# Patient Record
Sex: Female | Born: 1960
Health system: Southern US, Community
[De-identification: ages and names within clinical notes are randomized; demographics above are authoritative.]

## PROBLEM LIST (undated history)

## (undated) DIAGNOSIS — G43019 Migraine without aura, intractable, without status migrainosus: Secondary | ICD-10-CM

## (undated) DIAGNOSIS — R519 Headache, unspecified: Secondary | ICD-10-CM

## (undated) DIAGNOSIS — Z8619 Personal history of other infectious and parasitic diseases: Secondary | ICD-10-CM

## (undated) DIAGNOSIS — K635 Polyp of colon: Secondary | ICD-10-CM

## (undated) DIAGNOSIS — R51 Headache: Secondary | ICD-10-CM

## (undated) DIAGNOSIS — M797 Fibromyalgia: Secondary | ICD-10-CM

## (undated) HISTORY — DX: Personal history of other infectious and parasitic diseases: Z86.19

## (undated) HISTORY — DX: Fibromyalgia: M79.7

## (undated) HISTORY — DX: Headache: R51

## (undated) HISTORY — DX: Migraine without aura, intractable, without status migrainosus: G43.019

## (undated) HISTORY — DX: Polyp of colon: K63.5

## (undated) HISTORY — DX: Headache, unspecified: R51.9

---

## 1988-06-24 HISTORY — PX: OTHER SURGICAL HISTORY: SHX169

## 1995-06-25 HISTORY — PX: TUBAL LIGATION: SHX77

## 2002-06-24 HISTORY — PX: ELBOW SURGERY: SHX618

## 2003-06-25 HISTORY — PX: SESMOIDECTOMY: SHX6205

## 2009-06-24 HISTORY — PX: ENDOMETRIAL ABLATION: SHX621

## 2013-02-15 ENCOUNTER — Ambulatory Visit: Payer: Self-pay

## 2013-03-12 ENCOUNTER — Encounter: Payer: Self-pay | Admitting: Gastroenterology

## 2013-05-28 ENCOUNTER — Other Ambulatory Visit: Payer: Self-pay | Admitting: Gastroenterology

## 2013-07-25 LAB — HM COLONOSCOPY

## 2013-08-09 ENCOUNTER — Ambulatory Visit: Payer: Self-pay | Admitting: Gastroenterology

## 2013-08-26 ENCOUNTER — Encounter: Payer: Self-pay | Admitting: General Surgery

## 2013-08-26 ENCOUNTER — Ambulatory Visit (INDEPENDENT_AMBULATORY_CARE_PROVIDER_SITE_OTHER): Payer: 59 | Admitting: General Surgery

## 2013-08-26 VITALS — BP 128/82 | HR 80 | Temp 98.1°F | Resp 12 | Ht 67.0 in | Wt 160.0 lb

## 2013-08-26 DIAGNOSIS — L02211 Cutaneous abscess of abdominal wall: Secondary | ICD-10-CM

## 2013-08-26 DIAGNOSIS — L02219 Cutaneous abscess of trunk, unspecified: Secondary | ICD-10-CM

## 2013-08-26 DIAGNOSIS — L03319 Cellulitis of trunk, unspecified: Secondary | ICD-10-CM

## 2013-08-26 NOTE — Progress Notes (Addendum)
Patient ID: Heather Maldonado, female   DOB: 03/16/1961, 53 y.o.   MRN: [id number]  Chief Complaint  Patient presents with  . Other    New patient evaluation of right midriff abscess    HPI Heather Maldonado is a 53 y.o. female.  Here today for evaluation of right abdomen abscess. She is currently on antibiotics that is not helping. Area is red and painful worse since Saturday. She noticed it Aug 07 2013 feels like it maybe a "bug bite". HPI  Past Medical History  Diagnosis Date  . Colon polyp   . Headache     Past Surgical History  Procedure Laterality Date  . Cesarean section  1985  . Endometrial ablation  2011  . Sigmoidectomy Right 2005    right big toe  . Elbow surgery Right 2004    tendon   . Tubal ligation  1997  . Ureter repair  1990    kink in ureter    History reviewed. No pertinent family history.  Social History History  Substance Use Topics  . Smoking status: Never Smoker   . Smokeless tobacco: Never Used  . Alcohol Use: Yes    No Known Allergies  Current Outpatient Prescriptions  Medication Sig Dispense Refill  . sulfamethoxazole-trimethoprim (BACTRIM DS) 800-160 MG per tablet Take 1 tablet by mouth 2 (two) times daily.       No current facility-administered medications for this visit.    Review of Systems Review of Systems  Constitutional: Negative.   Respiratory: Negative.   Cardiovascular: Negative.     Blood pressure 128/82, pulse 80, temperature 98.1 F (36.7 C), temperature source Oral, resp. rate 12, height 5\' 7"  (1.702 m), weight 160 lb (72.576 kg).  Physical Exam Physical Exam  Constitutional: She appears well-developed and well-nourished.  Abdominal: Soft. There is no tenderness.  Skin: Skin is warm and dry.  3 cm raised red area RUQ abd wall tender with fluctuant middle.    Data Reviewed    Assessment    Cutaneous abscess right upper abdominal area-bug bite by history     Plan    Drainage-completed with consent.       Procedure: The abscess area was prepped with chloroprep adn 66ml 1% xylocaine instilled. Cruciate incision made and corners trimmed.Moderate liquid and thoick pus -about 97ml drained. C/S sent. Dry dressing.  Janzen Sacks G 08/27/2013, 6:30 AM

## 2013-08-26 NOTE — Patient Instructions (Signed)
Keep area clean Change dressing as needed

## 2013-08-27 ENCOUNTER — Encounter: Payer: Self-pay | Admitting: General Surgery

## 2013-08-31 LAB — ANAEROBIC AND AEROBIC CULTURE

## 2013-09-08 ENCOUNTER — Telehealth: Payer: Self-pay

## 2013-09-08 ENCOUNTER — Ambulatory Visit: Payer: 59 | Admitting: General Surgery

## 2013-09-08 NOTE — Telephone Encounter (Signed)
Patient called to cancel follow up appointment. She states that she is doing much better and did not feel the need to be seen by the physician today. I told her to call us with any further problems or concerns.

## 2014-04-05 ENCOUNTER — Ambulatory Visit: Payer: Self-pay | Admitting: Obstetrics and Gynecology

## 2014-04-25 ENCOUNTER — Encounter: Payer: Self-pay | Admitting: General Surgery

## 2015-03-25 LAB — HM PAP SMEAR

## 2015-03-25 LAB — HM MAMMOGRAPHY: HM MAMMO: NORMAL (ref 0–4)

## 2015-07-06 DIAGNOSIS — Z029 Encounter for administrative examinations, unspecified: Secondary | ICD-10-CM | POA: Diagnosis not present

## 2015-07-06 DIAGNOSIS — Z87828 Personal history of other (healed) physical injury and trauma: Secondary | ICD-10-CM | POA: Diagnosis not present

## 2015-09-15 DIAGNOSIS — H5213 Myopia, bilateral: Secondary | ICD-10-CM | POA: Diagnosis not present

## 2015-09-15 DIAGNOSIS — H524 Presbyopia: Secondary | ICD-10-CM | POA: Diagnosis not present

## 2015-11-22 DIAGNOSIS — Z0000 Encounter for general adult medical examination without abnormal findings: Secondary | ICD-10-CM | POA: Diagnosis not present

## 2016-04-09 ENCOUNTER — Telehealth: Payer: Self-pay | Admitting: Behavioral Health

## 2016-04-09 ENCOUNTER — Encounter: Payer: Self-pay | Admitting: Behavioral Health

## 2016-04-09 NOTE — Telephone Encounter (Signed)
Pre-Visit Call completed with patient and chart updated.   Pre-Visit Info documented in Specialty Comments under [employer].    

## 2016-04-10 ENCOUNTER — Ambulatory Visit (INDEPENDENT_AMBULATORY_CARE_PROVIDER_SITE_OTHER): Payer: 59 | Admitting: Family Medicine

## 2016-04-10 ENCOUNTER — Encounter: Payer: Self-pay | Admitting: Family Medicine

## 2016-04-10 VITALS — BP 110/54 | HR 59 | Temp 98.1°F | Ht 67.0 in | Wt 161.0 lb

## 2016-04-10 DIAGNOSIS — G43009 Migraine without aura, not intractable, without status migrainosus: Secondary | ICD-10-CM | POA: Diagnosis not present

## 2016-04-10 DIAGNOSIS — F411 Generalized anxiety disorder: Secondary | ICD-10-CM | POA: Diagnosis not present

## 2016-04-10 MED ORDER — SUMATRIPTAN SUCCINATE 100 MG PO TABS: 100.0000 mg | ORAL_TABLET | ORAL | 0 refills | Status: DC | PRN

## 2016-04-10 NOTE — Progress Notes (Signed)
Pre visit review using our clinic review tool, if applicable. No additional management support is needed unless otherwise documented below in the visit note. 

## 2016-04-10 NOTE — Progress Notes (Signed)
Chief Complaint  Patient presents with  . Establish Care    pt want to discuss headaches-(on and off) since last year.       New Patient Visit SUBJECTIVE: HPI: Heather Maldonado is an 55 y.o.female who is being seen for establishing care.  The patient was previously seen at [facility] through [facility] retired and is changing to a [facility] provider, wanted everything to be under same system.  Headaches For a little over a year, the pt will get frontal headaches lasting an entire day associated with nausea and sound sensitivity. Describes as a pounding headache. Gets around 3 per month. She will sometimes have nighttime awakenings from the HA's. Did have a neg CT done earlier in the year. She takes Naproxen, which helps a little, Tylenol 3's help the most. Never been tx'd for migraines. Denies vision changes, difficulty with speech, numbness, tingling or balance issues.   Sleep Has racing thoughts before she goes to bed and difficulty falling asleep. Going on for around 1 year. +family history of anxiety, does note that she has been more anxious lately. She has never been treated for this before.   Allergies  Allergen Reactions  . Bee Venom Anaphylaxis    Past Medical History:  Diagnosis Date  . Colon polyp   . Headache   . History of chicken pox    Past Surgical History:  Procedure Laterality Date  . CESAREAN SECTION  1985  . ELBOW SURGERY Right 2004   tendon   . ENDOMETRIAL ABLATION  2011  . SIGMOIDECTOMY Right 2005   right big toe  . TUBAL LIGATION  1997  . ureter repair  1990   kink in ureter   Social History   Social History  . Marital status: Married   Social History Main Topics  . Smoking status: Never Smoker  . Smokeless tobacco: Never Used  . Alcohol use Yes  . Drug use: No   Family History  Problem Relation Age of Onset  . Cancer Maternal Aunt     Breast   Takes no medications routinely.  No LMP recorded. Patient has had an  ablation.  ROS Psych: +insomnia, anxiety  Neuro: +HA, no numbness or tingling   OBJECTIVE: BP (!) 110/54 (BP Location: Left Arm, Patient Position: Sitting, Cuff Size: Normal)   Pulse (!) 59   Temp 98.1 F (36.7 C) (Oral)   Ht 5\' 7"  (1.702 m)   Wt 161 lb (73 kg)   SpO2 98%   BMI 25.22 kg/m   Constitutional: -  VS reviewed -  Well developed, well nourished, appears stated age -  No apparent distress  Psychiatric: -  Oriented to person, place, and time -  Memory intact -  Affect and mood normal -  Fluent conversation, good eye contact -  Judgment and insight age appropriate  Eye: -  Conjunctivae clear, no discharge -  Pupils symmetric, round, reactive to light  ENMT: -  Ears are patent b/l without erythema or discharge. TM's are shiny and clear b/l without evidence of effusion or infection. -  Oral mucosa without lesions, tongue and uvula midline    Tonsils not enlarged, no erythema, no exudate, trachea midline    Pharynx moist, no lesions, no erythema  Neck: -  No gross swelling, no palpable masses -  Thyroid midline, not enlarged, mobile, no palpable masses  Cardiovascular: -  RRR, no murmurs -  No LE edema  Respiratory: -  Normal  respiratory effort, no accessory muscle use, no retraction -  Breath sounds equal, no wheezes, no ronchi, no crackles  Neurological:  -  CN II - XII grossly intact -  Biceps and calcaneal reflex 1/4 b/l wo clonus, patellar reflex is 2/4 b/l wo clonus -  Sensation grossly intact to light touch, equal bilaterally  Musculoskeletal: -  No clubbing, no cyanosis -  Gait normal  Skin: -  No significant lesion on inspection -  Warm and dry to palpation   ASSESSMENT/PLAN: Migraine without aura and without status migrainosus, not intractable - Plan: SUMAtriptan (IMITREX) 100 MG tablet  Generalized anxiety disorder  Patient instructed to sign release of records form from her previous PCP. Hx supportive of migraines- will try abortive. Not frequent  enough for daily prophylaxis just yet. Discussed starting medication for anxiety vs providing resources for counseling/CBT. Opted for the latter. Patient should return in 1 mo to recheck headaches and sleep issues. I will give her the option of trying a different migraine abortive vs just using Tylenol 3 prn. The patient voiced understanding and agreement to the plan.   [place], DO 04/10/16  3:01 PM

## 2016-04-10 NOTE — Patient Instructions (Addendum)
Please call [phone] to schedule an appointment with counseling and determining insurance coverage.

## 2016-05-08 ENCOUNTER — Telehealth: Payer: Self-pay | Admitting: Family Medicine

## 2016-05-08 NOTE — Telephone Encounter (Signed)
Caller name: Relationship to patient: Self Can be reached: [phone]  Pharmacy:  [place], [place] [phone] (Phone) [phone] (Fax)     Reason for call: States that the SUMAtriptan (IMITREX) 100 MG tablet [id number] is not helping her headaches and request a Rx for Tylenol #3. As discussed.

## 2016-05-08 NOTE — Telephone Encounter (Signed)
Please advise.//AB/CMA 

## 2016-05-08 NOTE — Telephone Encounter (Signed)
I have reached out to [facility] to call in Rx for Tylenol #3 1q8 hours as needed for headaches.#30 tablets with 0 Refills. I spoke with Ronalee Belts who has placed order.   I have also reached out to patient to alert her Rx was called into the pharmacy.I LVMOM for pt to return my call into the office. TL/CMA

## 2016-05-08 NOTE — Telephone Encounter (Signed)
OK to call in. #30, take prn for headaches. No refills. Thanks.

## 2016-05-22 ENCOUNTER — Encounter: Payer: Self-pay | Admitting: Family Medicine

## 2016-05-22 ENCOUNTER — Ambulatory Visit (INDEPENDENT_AMBULATORY_CARE_PROVIDER_SITE_OTHER): Payer: 59 | Admitting: Family Medicine

## 2016-05-22 VITALS — BP 96/58 | HR 62 | Temp 98.1°F | Ht 67.0 in | Wt 159.8 lb

## 2016-05-22 DIAGNOSIS — G43109 Migraine with aura, not intractable, without status migrainosus: Secondary | ICD-10-CM | POA: Diagnosis not present

## 2016-05-22 DIAGNOSIS — G4700 Insomnia, unspecified: Secondary | ICD-10-CM

## 2016-05-22 DIAGNOSIS — R6882 Decreased libido: Secondary | ICD-10-CM

## 2016-05-22 NOTE — Patient Instructions (Addendum)
Magnesium 400-500 mg daily for headaches if you start having more frequent headaches.  Melatonin 2-3 mg daily around 45-60 min prior to planned bedtime.  Contact [phone] to schedule an appointment or inquire about cost/insurance coverage. If the issue is related to sleep, ask for cognitive behavioral therapy. If it is related to sex drive, ask for sexual counseling.

## 2016-05-22 NOTE — Progress Notes (Signed)
Chief Complaint  Patient presents with  . Follow-up    on new medication    Heather Maldonado is a 55 y.o. female here for evaluation of headache.  Duration: 1 year Palliation: Tylenol 3 Provocation: possibly stress Associated symptoms: nausea, sonophobia, photophobia Denies: vomiting, vertigo, tinnitus Currently with headache? No Failed therapies: Imitrex  She is also been having difficulty falling asleep and is requesting a medication to help her. Her biggest issue is that she is very sensitive to sound and she's tried earplugs that were not comfortable. Her husband breathing bothers her even. When she falls asleep, she has no difficulty staying asleep and does not have any daytime fatigue. She does not nap. She does not snore and has never been told that she stops breathing at night.  Over the past year, she is also having decreased sex drive. She is also having vaginal dryness, however this is being addressed by her GYN. She has no desire to either masturbate or be intimate with her husband.  BP (!) 96/58 (BP Location: Left Arm, Patient Position: Sitting, Cuff Size: Normal)   Pulse 62   Temp 98.1 F (36.7 C) (Oral)   Ht 5\' 7"  (1.702 m)   Wt 159 lb 12.8 oz (72.5 kg)   SpO2 98%   BMI 25.03 kg/m  General: awake, alert, appearing stated age Eyes: PERRLA, EOMi Heart: RRR, no murmurs, no bruits Lungs: CTAB, no accessory muscle use Neuro: CN 2-12 intact, no cerebellar signs, DTR's equal and symmetry, no clonus MSK: 5/5 strength throughout, normal gait, no TTP over posterior cervical triangle or paraspinal cervical musculature Psych: Age appropriate judgment and insight, mood and affect normal  Migraine with aura and without status migrainosus, not intractable  Insomnia, unspecified type  Decreased sex drive - Plan: TSH, Testosterone, Comprehensive metabolic panel  Orders as above. Noted that she could take magnesium daily she starts having more frequent headaches. I am okay with  prescribing Tylenol 3 and limited amounts for abortive. If she needs it more frequent, I will refer to neurology. Recommended melatonin for sleep issues. She is still having issues at her follow-up appointment, will recommend trazodone. Gave number for both CBT and sex counseling in [place]. Follow up in 4 weeks. The patient voiced understanding and agreement to the plan.  [place], [place] 4:58 PM 05/22/16

## 2016-05-22 NOTE — Progress Notes (Signed)
Pre visit review using our clinic review tool, if applicable. No additional management support is needed unless otherwise documented below in the visit note. 

## 2016-05-23 ENCOUNTER — Encounter: Payer: Self-pay | Admitting: Family Medicine

## 2016-05-23 LAB — COMPREHENSIVE METABOLIC PANEL
ALT: 33 U/L (ref 0–35)
AST: 28 U/L (ref 0–37)
Albumin: 4.4 g/dL (ref 3.5–5.2)
Alkaline Phosphatase: 63 U/L (ref 39–117)
BUN: 17 mg/dL (ref 6–23)
CHLORIDE: 105 meq/L (ref 96–112)
CO2: 28 meq/L (ref 19–32)
CREATININE: 0.77 mg/dL (ref 0.40–1.20)
Calcium: 9.4 mg/dL (ref 8.4–10.5)
GFR: 83.05 mL/min (ref >60.00)
Glucose, Bld: 86 mg/dL (ref 70–99)
Potassium: 4.4 mEq/L (ref 3.5–5.1)
SODIUM: 141 meq/L (ref 135–145)
Total Bilirubin: 0.4 mg/dL (ref 0.2–1.2)
Total Protein: 6.9 g/dL (ref 6.0–8.3)

## 2016-05-23 LAB — TSH: TSH: 1.44 u[IU]/mL (ref 0.35–4.50)

## 2016-05-23 LAB — TESTOSTERONE: Testosterone: 1.1 ng/dL — ABNORMAL LOW (ref 15.00–40.00)

## 2016-06-21 ENCOUNTER — Ambulatory Visit (INDEPENDENT_AMBULATORY_CARE_PROVIDER_SITE_OTHER): Payer: 59 | Admitting: Family Medicine

## 2016-06-21 ENCOUNTER — Telehealth: Payer: Self-pay | Admitting: Family Medicine

## 2016-06-21 ENCOUNTER — Other Ambulatory Visit (INDEPENDENT_AMBULATORY_CARE_PROVIDER_SITE_OTHER): Payer: 59

## 2016-06-21 VITALS — BP 120/82 | HR 72 | Temp 98.2°F | Ht 67.0 in | Wt 157.6 lb

## 2016-06-21 DIAGNOSIS — R5383 Other fatigue: Secondary | ICD-10-CM | POA: Diagnosis not present

## 2016-06-21 DIAGNOSIS — G43109 Migraine with aura, not intractable, without status migrainosus: Secondary | ICD-10-CM | POA: Diagnosis not present

## 2016-06-21 DIAGNOSIS — Z13220 Encounter for screening for lipoid disorders: Secondary | ICD-10-CM

## 2016-06-21 LAB — COMPREHENSIVE METABOLIC PANEL
ALK PHOS: 74 U/L (ref 39–117)
ALT: 23 U/L (ref 0–35)
AST: 24 U/L (ref 0–37)
Albumin: 4.8 g/dL (ref 3.5–5.2)
BILIRUBIN TOTAL: 0.7 mg/dL (ref 0.2–1.2)
BUN: 13 mg/dL (ref 6–23)
CO2: 30 meq/L (ref 19–32)
CREATININE: 0.73 mg/dL (ref 0.40–1.20)
Calcium: 10 mg/dL (ref 8.4–10.5)
Chloride: 101 mEq/L (ref 96–112)
GFR: 88.29 mL/min (ref >60.00)
GLUCOSE: 91 mg/dL (ref 70–99)
Potassium: 4.2 mEq/L (ref 3.5–5.1)
Sodium: 140 mEq/L (ref 135–145)
TOTAL PROTEIN: 7.6 g/dL (ref 6.0–8.3)

## 2016-06-21 LAB — CBC WITH DIFFERENTIAL/PLATELET
BASOS ABS: 0 10*3/uL (ref 0.0–0.1)
BASOS PCT: 0.7 % (ref 0.0–3.0)
EOS ABS: 0.2 10*3/uL (ref 0.0–0.7)
Eosinophils Relative: 2.6 % (ref 0.0–5.0)
HCT: 42 % (ref 36.0–46.0)
Hemoglobin: 14.4 g/dL (ref 12.0–15.0)
LYMPHS ABS: 3.7 10*3/uL (ref 0.7–4.0)
Lymphocytes Relative: 50.8 % — ABNORMAL HIGH (ref 12.0–46.0)
MCHC: 34.2 g/dL (ref 30.0–36.0)
MCV: 84.6 fl (ref 78.0–100.0)
Monocytes Absolute: 0.4 10*3/uL (ref 0.1–1.0)
Monocytes Relative: 5.8 % (ref 3.0–12.0)
NEUTROS ABS: 2.9 10*3/uL (ref 1.4–7.7)
NEUTROS PCT: 40.1 % — AB (ref 43.0–77.0)
PLATELETS: 249 10*3/uL (ref 150.0–400.0)
RBC: 4.97 Mil/uL (ref 3.87–5.11)
RDW: 13 % (ref 11.5–15.5)
WBC: 7.2 10*3/uL (ref 4.0–10.5)

## 2016-06-21 LAB — LIPID PANEL
Cholesterol: 231 mg/dL — ABNORMAL HIGH (ref 0–200)
HDL: 82.1 mg/dL (ref >39.00)
LDL Cholesterol: 131 mg/dL — ABNORMAL HIGH (ref 0–99)
NONHDL: 149.26
TRIGLYCERIDES: 91 mg/dL (ref 0.0–149.0)
Total CHOL/HDL Ratio: 3
VLDL: 18.2 mg/dL (ref 0.0–40.0)

## 2016-06-21 MED ORDER — KETOROLAC TROMETHAMINE 60 MG/2ML IM SOLN
60.0000 mg | Freq: Once | INTRAMUSCULAR | Status: AC
  Administered 2016-06-21: 60 mg via INTRAMUSCULAR

## 2016-06-21 MED ORDER — ONDANSETRON HCL 4 MG PO TABS: 4.0000 mg | ORAL_TABLET | Freq: Three times a day (TID) | ORAL | 0 refills | Status: DC | PRN

## 2016-06-21 NOTE — Telephone Encounter (Signed)
Faxed add on request to lab.

## 2016-06-21 NOTE — Addendum Note (Signed)
Addended by: Harl Bowie on: 06/21/2016 09:25 AM   Modules accepted: Orders

## 2016-06-21 NOTE — Telephone Encounter (Signed)
Ok to add chem-12. TY.

## 2016-06-21 NOTE — Telephone Encounter (Signed)
Patient called stating that she was seen today and had lab work done. She would like to know if a chemistry test could be added to the lab work that was already taken. Please advise   Phone: [phone]

## 2016-06-21 NOTE — Patient Instructions (Addendum)
Take the magnesium every day.

## 2016-06-21 NOTE — Progress Notes (Signed)
Chief Complaint  Patient presents with  . Follow-up    on headaches-pt states not getting an better    Heather Maldonado is a 55 y.o. female here for evaluation of headache.   Duration: >1 year; her headache has been constant since last visiting here on 11/29. She was placed on Mg at that time and has been taking it on a prn basis. Palliation: T3's Provocation: stress Frontal Headache  Severity: 8 Quality: sharp pains during achiness Associated symptoms: nausea, sonophobia, photophobia  Denies: nausea, vomiting, vertigo Currently with headache? Yes Failed therapies: Imitrex +nighttime awakenings   BP 120/82 (BP Location: Left Arm, Patient Position: Sitting, Cuff Size: Normal)   Pulse 72   Temp 98.2 F (36.8 C) (Oral)   Ht 5\' 7"  (1.702 m)   Wt 157 lb 9.6 oz (71.5 kg)   SpO2 98%   BMI 24.68 kg/m  General: awake, alert, appearing stated age Eyes: PERRLA, EOMi Heart: RRR, no murmurs, no bruits Lungs: CTAB, no accessory muscle use Neuro: CN 2-12 intact, no cerebellar signs, DTR's equal and symmetry, no clonus MSK: 5/5 strength throughout, normal gait, no TTP over posterior cervical triangle or paraspinal cervical musculature Psych: Age appropriate judgment and insight, mood and affect normal  Migraine with aura and without status migrainosus, not intractable - Plan: CBC w/Diff, ondansetron (ZOFRAN) 4 MG tablet  Screening cholesterol level - Plan: Lipid panel  Orders as above.  Pt upset about her situation and is demanding blood work, specifically a CBC. I informed her that there are no current recommendations for screening CBC's and I do not have a current medical reason for this test with her headaches. She would like it ordered anyway, even after I warned her there may be a bill. Offered MRI head given her nighttime awakenings, she would like to wait for this.  Needs to take magnesium everyday. Pt has had a tubal ligation. Could consider Topamax in the future. Did briefly  mention testosterone. On the female's scale, she is low. I stated that this was ordered as something that could be considered as 3rd or 4th line therapy should sexual counseling, lubricants and scheduled date nights fail. She seemed frustrated by this and I apologized for the confusion. Follow up in 4 weeks. The patient voiced understanding and agreement to the plan.  [place], [place] 8:28 AM 06/21/16

## 2016-07-05 ENCOUNTER — Encounter: Payer: Self-pay | Admitting: Obstetrics & Gynecology

## 2016-07-05 ENCOUNTER — Ambulatory Visit: Payer: 59 | Admitting: Family Medicine

## 2016-07-05 ENCOUNTER — Ambulatory Visit (INDEPENDENT_AMBULATORY_CARE_PROVIDER_SITE_OTHER): Payer: 59 | Admitting: Obstetrics & Gynecology

## 2016-07-05 VITALS — BP 112/64 | HR 64 | Resp 14 | Ht 65.5 in | Wt 158.0 lb

## 2016-07-05 DIAGNOSIS — Z01419 Encounter for gynecological examination (general) (routine) without abnormal findings: Secondary | ICD-10-CM | POA: Diagnosis not present

## 2016-07-05 DIAGNOSIS — Z1151 Encounter for screening for human papillomavirus (HPV): Secondary | ICD-10-CM | POA: Diagnosis not present

## 2016-07-05 DIAGNOSIS — M797 Fibromyalgia: Secondary | ICD-10-CM | POA: Insufficient documentation

## 2016-07-05 DIAGNOSIS — Z124 Encounter for screening for malignant neoplasm of cervix: Secondary | ICD-10-CM | POA: Diagnosis not present

## 2016-07-05 DIAGNOSIS — Z1231 Encounter for screening mammogram for malignant neoplasm of breast: Secondary | ICD-10-CM

## 2016-07-05 DIAGNOSIS — N921 Excessive and frequent menstruation with irregular cycle: Secondary | ICD-10-CM | POA: Insufficient documentation

## 2016-07-05 MED ORDER — ESTRADIOL 2 MG VA RING
2.0000 mg | VAGINAL_RING | VAGINAL | 4 refills | Status: DC
Start: 2016-07-05 — End: 2017-02-03

## 2016-07-05 NOTE — Progress Notes (Signed)
56 y.o. G1P2 MarriedCaucasianF here for annual exam.  She is a new patient.  Hasn't cycled for about 10 years.  Never on HRT.  Reports she does have low testosterone.    No LMP recorded. Patient has had an ablation.          Sexually active: Yes.    The current method of family planning is tubal ligation.    Exercising: Yes.    walking, weights Smoker:  no  Health Maintenance: Pap:  10/16 done at [facility] in [place] (Dr. Kenton Kingfisher)  History of abnormal Pap:  yes MMG:  10/16 done at [facility] in [place] Colonoscopy:  07/25/13 Dr. Allen Norris- polyps- repeat 3 years, affiliate with [facility] BMD:   never TDaP:  06/24/12 Pneumonia vaccine(s):  never Zostavax:   Never Hep C testing: donated blood in 12/17  Screening Labs: PCP, Hb today: PCP, Urine today: declined   reports that she has never smoked. She has never used smokeless tobacco. She reports that she drinks alcohol. She reports that she does not use drugs.  Past Medical History:  Diagnosis Date  . Colon polyp   . Headache   . History of chicken pox     Past Surgical History:  Procedure Laterality Date  . CESAREAN SECTION  1985  . ELBOW SURGERY Right 2004   tendon   . ENDOMETRIAL ABLATION  2011  . SIGMOIDECTOMY Right 2005   right big toe  . TUBAL LIGATION  1997  . ureter repair  1990   kink in ureter    Current Outpatient Prescriptions  Medication Sig Dispense Refill  . acetaminophen-codeine (TYLENOL #3) 300-30 MG tablet Take 1 tablet by mouth every 8 hours as needed for headaches.  0  . GINSENG PO Take 100 mg by mouth daily.    Marland Kitchen LORATADINE PO Take 10 mg by mouth daily.    Marland Kitchen MAGNESIUM PO Take 500 mg by mouth daily.    Marland Kitchen MELATONIN PO Take 5 mg by mouth at bedtime.    . Multiple Vitamins-Minerals (MULTIVITAMIN PO) Take by mouth daily. 2 gummies     No current facility-administered medications for this visit.     Family History  Problem Relation Age of Onset  . Cancer Maternal Aunt     Breast    ROS:   Pertinent items are noted in HPI.  Otherwise, a comprehensive ROS was negative.  Exam:   BP 112/64 (BP Location: Right Arm, Patient Position: Sitting, Cuff Size: Normal)   Pulse 64   Resp 14   Ht 5' 5.5" (1.664 m)   Wt 158 lb (71.7 kg)   BMI 25.89 kg/m    Height: 5' 5.5" (166.4 cm)  Ht Readings from Last 3 Encounters:  07/05/16 5' 5.5" (1.664 m)  06/21/16 5\' 7"  (1.702 m)  05/22/16 5\' 7"  (1.702 m)    General appearance: alert, cooperative and appears stated age Head: Normocephalic, without obvious abnormality, atraumatic Neck: no adenopathy, supple, symmetrical, trachea midline and thyroid normal to inspection and palpation Breasts: normal appearance, no masses or tenderness Abdomen: soft, non-tender; bowel sounds normal; no masses,  no organomegaly Extremities: extremities normal, atraumatic, no cyanosis or edema Skin: Skin color, texture, turgor normal. No rashes or lesions Lymph nodes: Cervical, supraclavicular, and axillary nodes normal. No abnormal inguinal nodes palpated Neurologic: Grossly normal   Pelvic: External genitalia:  no lesions, skin tag left inferior vulva              Urethra:  normal appearing  urethra with no masses, tenderness or lesions              Bartholins and Skenes: normal                 Vagina: normal appearing vagina with normal color and discharge, no lesions              Cervix: no lesions              Pap taken: Yes.   Bimanual Exam:  Uterus:  normal size, contour, position, consistency, mobility, non-tender              Adnexa: normal adnexa and no mass, fullness, tenderness               Rectovaginal: Confirms               Anus:  normal sphincter tone, no lesions  Chaperone was present for exam.  A:  Well Woman with normal exam PMP, no HRT Low testosterone, decreased libido Pain with intercourse, not successful with using Estrace because of twice weekly dosing Fibromyalgia H/O migraines (without aura--according to pt)  P:   Mammogram  guidelines reviewed.  We will schedule MMG for her Pap smear and HR HPV obtained today Topical testosterone propionate 2% 1/4 tsp 2-3 times weekly.  #60 grams/0RF.  Side effects discussed.  Pt will need recheck 3 months. Return for skin tag removal Rx for estring 2mg  pv q 3 months to pharmacy return annually or prn

## 2016-07-05 NOTE — Addendum Note (Signed)
Addended by: Polly Cobia on: 07/05/2016 10:21 AM   Modules accepted: Orders

## 2016-07-05 NOTE — Progress Notes (Signed)
Prescription for topical testosterone faxed to [facility]. Fax #: [phone].

## 2016-07-10 LAB — IPS PAP TEST WITH HPV

## 2016-07-15 ENCOUNTER — Telehealth: Payer: Self-pay | Admitting: Obstetrics & Gynecology

## 2016-07-15 ENCOUNTER — Telehealth: Payer: Self-pay | Admitting: Family Medicine

## 2016-07-15 MED ORDER — MELOXICAM 7.5 MG PO TABS: 7.5000 mg | ORAL_TABLET | Freq: Every day | ORAL | 0 refills | Status: DC

## 2016-07-15 NOTE — Telephone Encounter (Signed)
Rx sent, notified pt. 

## 2016-07-15 NOTE — Telephone Encounter (Signed)
Patient is calling to speak with the nurse concerning the compound medication. She also is requesting a inflammatory drug for her knuckles. She is using the pharmacy at [facility].

## 2016-07-15 NOTE — Telephone Encounter (Signed)
Relation to [id number] Call back number:[phone] Pharmacy: [place], [place] [phone] (Phone) [phone] (Fax)     Reason for call:  Patient requesting meloxicam due to inflamed joints, patient states she's remodeling her home and experiencing body ache, please advise

## 2016-07-15 NOTE — Telephone Encounter (Signed)
Routed to Triage

## 2016-07-15 NOTE — Telephone Encounter (Signed)
Spoke with patient. Patient states [facility] can not compound prescription for topical testosterone. Patient states she would like prescription sent to [facility] or [facility] outpatient pharmacy.Advised patient that the [facility] outpatient pharmacy will not be able to fill prescription for the compound. Advised patient [facility] or [facility] can fill compounded prescriptions. Patient states ok to sent to [facility]. Patient also asking if Dr. Sabra Heck can send in a prescription for meloxicam for inflamed finger joints? Patient states it is d/t remodeling. Advised patient to f/u with pcp for meloxicam prescription. Patient ask if RN can "run it by Dr. Sabra Heck"? Advised patient Dr. Sabra Heck is in surgery this morning response may not be immediate, will return call with any additional recommendations, patient is agreeable.  Dr. Sabra Heck -any additional recommendations?  Prescription for testosterone cream faxed to [facility] at [phone].  Routing to provider for final review. Patient is agreeable to disposition. Will close encounter.

## 2016-07-15 NOTE — Telephone Encounter (Signed)
OK to do. 7.5 mg daily, disp 30, no refills. TY.

## 2016-07-16 NOTE — Telephone Encounter (Signed)
Spoke with patient, advised as seen below per Dr. Sabra Heck. Patient states she was able to get meloxicam filled by pcp, thankful for return call. Patient verbalizes understanding and is agreeable.  Routing to provider for final review. Patient is agreeable to disposition. Will close encounter.

## 2016-07-16 NOTE — Telephone Encounter (Signed)
No additional recommendations.  I agree with you having her see PCP for the Meloxicam rx.  Please notify her of this if needed.  Thanks.

## 2016-07-19 ENCOUNTER — Ambulatory Visit: Payer: 59 | Admitting: Family Medicine

## 2016-07-23 ENCOUNTER — Encounter: Payer: Self-pay | Admitting: Obstetrics & Gynecology

## 2016-07-23 ENCOUNTER — Ambulatory Visit: Payer: Self-pay | Admitting: Obstetrics & Gynecology

## 2016-07-23 VITALS — BP 110/64 | HR 64 | Resp 12 | Ht 66.0 in | Wt 158.6 lb

## 2016-07-23 DIAGNOSIS — N9089 Other specified noninflammatory disorders of vulva and perineum: Secondary | ICD-10-CM | POA: Diagnosis not present

## 2016-07-23 DIAGNOSIS — L919 Hypertrophic disorder of the skin, unspecified: Secondary | ICD-10-CM | POA: Diagnosis not present

## 2016-07-23 NOTE — Progress Notes (Signed)
Patient in office for visit and asking about the status of her topical testosterone prescription that was faxed on Monday 07/15/16. RN called [employer], and per Abigail Butts, patient needs to call and give them her information before they can begin compounding the medication. Patient aware and will call [facility].

## 2016-07-23 NOTE — Progress Notes (Signed)
Patient ID: Heather Maldonado, female   DOB: 1960/12/23, 56 y.o.   MRN: [id number]  GYNECOLOGY  VISIT   HPI: 56 y.o. G1P1 Married Caucasian female here for removal of vulvar lesion that appears to be a skin tag.  Pt actually has two additional and smaller lesions that she would like removed as well.  Denies itching, bleeding.  Pt tried to fill estring but has some questions regarding cost.  Savings coupon given.  Pt will try this and let me know results after three months.  Also, she has not gotten a call from [facility] regarding her testosterone.  The rx was sent through but she has not gotten a called.  CMA called today and pt needs to call [facility] to provide information for getting rx filled.  Pt aware.  GYNECOLOGIC HISTORY: No LMP recorded. Patient has had an ablation. Contraception: PMP  Patient Active Problem List   Diagnosis Date Noted  . Fibromyalgia 07/05/2016  . Migraine headache with aura 05/22/2016    Past Medical History:  Diagnosis Date  . Colon polyp   . Headache   . History of chicken pox     Past Surgical History:  Procedure Laterality Date  . CESAREAN SECTION  1985  . ELBOW SURGERY Right 2004   tendon   . ENDOMETRIAL ABLATION  2011  . SESMOIDECTOMY Right 2005   right big toe  . TUBAL LIGATION  1997  . ureter repair  1990   kink in ureter    MEDS:  Reviewed in EPIC and UTD  ALLERGIES: Bee venom  Family History  Problem Relation Age of Onset  . Cancer Maternal Aunt     Breast    SH:  Married, non smoker  Review of Systems  All other systems reviewed and are negative.   PHYSICAL EXAMINATION:    BP 110/64 (BP Location: Right Arm, Patient Position: Sitting, Cuff Size: Normal)   Pulse 64   Resp 12   Ht 5\' 6"  (1.676 m)   Wt 158 lb 9.6 oz (71.9 kg)   BMI 25.60 kg/m     Physical Exam  Constitutional: She is oriented to person, place, and time. She appears well-developed and well-nourished.  Genitourinary:     Neurological: She is alert and  oriented to person, place, and time.  Skin: Skin is warm and dry.   Consent obtained.  Skin cleansed with Betadine x 3.  Skin anesthetized with 1% Lidocaine.  1.5cc total used.  Using sterile technique, all three lesions fully excised and sent for pathology.  Silver nitrate used for excellent hemostasis.  Pt tolerated procedure well.  Dressings applied.    Chaperone was present for exam.  Assessment: S/p excision of lesions consistent with skin tags  Plan: Lesions sent to pathology.  Pt will be called with results.

## 2016-07-29 DIAGNOSIS — Z803 Family history of malignant neoplasm of breast: Secondary | ICD-10-CM | POA: Diagnosis not present

## 2016-07-29 DIAGNOSIS — Z1231 Encounter for screening mammogram for malignant neoplasm of breast: Secondary | ICD-10-CM | POA: Diagnosis not present

## 2016-08-04 ENCOUNTER — Encounter: Payer: Self-pay | Admitting: Family Medicine

## 2016-08-05 NOTE — Telephone Encounter (Signed)
Please advise 

## 2016-08-07 ENCOUNTER — Ambulatory Visit: Payer: 59

## 2016-08-08 ENCOUNTER — Encounter: Payer: Self-pay | Admitting: Obstetrics & Gynecology

## 2016-08-08 NOTE — Telephone Encounter (Signed)
Patient called to follow up on request for refill on Tylenol 3. States it has not been called in to her pharmacy. Plse adv

## 2016-08-14 MED ORDER — ACETAMINOPHEN-CODEINE #3 300-30 MG PO TABS: ORAL_TABLET | ORAL | 1 refills | Status: DC

## 2016-08-14 NOTE — Telephone Encounter (Signed)
Refill approved for Tylenol #3 phoned in to [facility]).  Tylenol #3 300-30mg -Take 1 tablet by mouth every 8 hours as needed for headaches,#30,1-per Dr. Nani Ravens.  Pt aware.//AB/CMA

## 2016-08-19 ENCOUNTER — Telehealth: Payer: Self-pay | Admitting: Family Medicine

## 2016-08-19 MED ORDER — MELOXICAM 7.5 MG PO TABS: 7.5000 mg | ORAL_TABLET | Freq: Every day | ORAL | 5 refills | Status: DC

## 2016-08-19 NOTE — Telephone Encounter (Signed)
Refill with refills sent in.

## 2016-08-19 NOTE — Telephone Encounter (Signed)
Pt is requesting refill on Meloxicam.  Last Filled 07/15/2016 #30 and 0RF   Okay to refill?

## 2016-09-18 DIAGNOSIS — H524 Presbyopia: Secondary | ICD-10-CM | POA: Diagnosis not present

## 2016-11-05 ENCOUNTER — Encounter: Payer: Self-pay | Admitting: Family Medicine

## 2016-11-06 ENCOUNTER — Encounter: Payer: Self-pay | Admitting: Family Medicine

## 2016-11-27 ENCOUNTER — Encounter: Payer: Self-pay | Admitting: Family Medicine

## 2016-11-28 ENCOUNTER — Encounter: Payer: Self-pay | Admitting: Family Medicine

## 2016-11-28 NOTE — Telephone Encounter (Signed)
Requesting Tylenol #3 300-30mg -Take 1 tablet by mouth every 8 hours as needed for headaches. Last refill:08/14/16;#30,1 Last OV:06/21/16 Please advise.//AB/CMA

## 2016-11-29 MED ORDER — EPINEPHRINE 0.3 MG/0.3ML IJ SOAJ: INTRAMUSCULAR | 3 refills | Status: AC

## 2016-11-29 MED ORDER — EPINEPHRINE 0.3 MG/0.3ML IJ SOAJ: INTRAMUSCULAR | 1 refills | Status: DC

## 2016-11-29 NOTE — Telephone Encounter (Signed)
Called and spoke with the pt to see why she is needing the Epipen.  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  Rx approved and sent to the pharmacy by e-script.  Pt aware.//AB/CMA

## 2016-12-17 ENCOUNTER — Encounter: Payer: Self-pay | Admitting: Family Medicine

## 2017-01-01 ENCOUNTER — Encounter: Payer: Self-pay | Admitting: Family Medicine

## 2017-01-01 ENCOUNTER — Ambulatory Visit (INDEPENDENT_AMBULATORY_CARE_PROVIDER_SITE_OTHER): Payer: 59 | Admitting: Family Medicine

## 2017-01-01 ENCOUNTER — Ambulatory Visit: Payer: 59 | Admitting: Family Medicine

## 2017-01-01 VITALS — BP 110/70 | HR 55 | Temp 97.9°F | Ht 66.0 in | Wt 159.0 lb

## 2017-01-01 DIAGNOSIS — R202 Paresthesia of skin: Secondary | ICD-10-CM

## 2017-01-01 DIAGNOSIS — M79644 Pain in right finger(s): Secondary | ICD-10-CM

## 2017-01-01 DIAGNOSIS — R200 Anesthesia of skin: Secondary | ICD-10-CM

## 2017-01-01 NOTE — Progress Notes (Signed)
Musculoskeletal Exam  Patient: Heather Maldonado DOB: June 28, 1960  DOS: 01/01/2017  SUBJECTIVE:  Chief Complaint:   Chief Complaint  Patient presents with  . Hand Pain    (R) and tingling-since mid Jan-no injury    Heather Maldonado is a 56 y.o.  female for evaluation and treatment of R hand pain.   Onset:  6 months ago. Started after working out house, got significantly worse a couple weeks ago after painting/doing house work.  Location: Middle finger, PIP Character:  aching  Progression of issue:  Got worse a few weeks ago Associated symptoms:  Denies weakness Treatment: to date has been OTC NSAIDS.   Neurovascular symptoms: no   ROS: Musculoskeletal/Extremities: +R middle finger pain Neurologic: no numbness, tingling no weakness   Past Medical History:  Diagnosis Date  . Colon polyp   . Headache   . History of chicken pox    Past Surgical History:  Procedure Laterality Date  . CESAREAN SECTION  1985  . ELBOW SURGERY Right 2004   tendon   . ENDOMETRIAL ABLATION  2011  . SESMOIDECTOMY Right 2005   right big toe  . TUBAL LIGATION  1997  . ureter repair  1990   kink in ureter   Family History  Problem Relation Age of Onset  . Cancer Maternal Aunt        Breast   Current Outpatient Prescriptions  Medication Sig Dispense Refill  . acetaminophen-codeine (TYLENOL #3) 300-30 MG tablet Take 1 tablet by mouth every 8 hours as needed for headaches. 30 tablet 1  . EPINEPHrine (EPIPEN 2-PAK) 0.3 mg/0.3 mL IJ SOAJ injection Inject in case of anaphylaxis. 2 Device 3  . GINSENG PO Take 100 mg by mouth daily.    Marland Kitchen LORATADINE PO Take 10 mg by mouth daily.    Marland Kitchen MAGNESIUM PO Take 500 mg by mouth daily.    Marland Kitchen MELATONIN PO Take 5 mg by mouth at bedtime.    . meloxicam (MOBIC) 7.5 MG tablet Take 1 tablet (7.5 mg total) by mouth daily. 30 tablet 5  . Multiple Vitamins-Minerals (MULTIVITAMIN PO) Take by mouth daily. 2 gummies    . estradiol (ESTRING) 2 MG vaginal ring Place 2 mg  vaginally every 3 (three) months. Insert a new ring into vagina every 3 months (Patient not taking: Reported on 07/23/2016) 1 each 4   Allergies  Allergen Reactions  . Bee Venom Anaphylaxis   Social History   Social History  . Marital status: Married   Social History Main Topics  . Smoking status: Never Smoker  . Smokeless tobacco: Never Used  . Alcohol use Yes  . Drug use: No   Objective: VITAL SIGNS: BP 110/70 (BP Location: Left Arm, Patient Position: Sitting, Cuff Size: Normal)   Pulse (!) 55   Temp 97.9 F (36.6 C) (Oral)   Ht 5\' 6"  (1.676 m)   Wt 159 lb (72.1 kg)   SpO2 99%   BMI 25.66 kg/m  Constitutional: Well formed, well developed. No acute distress. Cardiovascular: Brisk cap refill Thorax & Lungs: No accessory muscle use Extremities: No clubbing. No cyanosis. No edema.  Skin: Warm. Dry. No erythema. No rash.  Musculoskeletal: R 3rd finger.   Normal active range of motion: no, decreased flexion.   Normal passive range of motion: no, decreased flexion Tenderness to palpation: Yes, over dorsal PIP Deformity: no Ecchymosis: no Grip strength was weaker on R 2/2 pain No muscle group atrophy Tests positive: None Tests negative: Phalen's, Reverse  Phalen's, Tinel's Neurologic: Decreased sensory function of R hand compared to L, R medial forearm (hx of surgery on R elbow). No focal deficits noted. DTR's equal and symmetry in UE's. No clonus. Psychiatric: Normal mood. Age appropriate judgment and insight. Alert & oriented x 3.    Assessment:  Numbness and tingling in right hand  Pain of finger of right hand  Plan: Buddy tape. Wrist splint at night and maybe during day. Offered XR but would hold off given out of pocket cost and chronicity. Ice. NSAIDs. Call in 3-4 weeks if no improvement, will refer to hand surgery. The patient voiced understanding and agreement to the plan.   [place], DO 01/01/17  3:03 PM

## 2017-01-01 NOTE — Patient Instructions (Signed)
Good seeing you again.  Wear the brace at night over the next 3-4 weeks. OK to use it during the day when you are typing at work. Stretch your wrist area.  Buddy tape your middle finger to your pointer or ring finger when able.   Ice/cold pack over area for 10-15 min every 2-3 hours while awake.  If you are not any better after the 3-4 weeks, send me a MyChart message and we will send you to a hand specialist.   If you change your mind about the X-ray, feel free to send me a MyChart message and I will place the order. There is a considerable out of pocket cost with this and you may want to double check with insurance before getting.  Let us know if you need anything.

## 2017-01-02 ENCOUNTER — Ambulatory Visit: Payer: 59 | Admitting: Family Medicine

## 2017-01-14 ENCOUNTER — Other Ambulatory Visit: Payer: Self-pay | Admitting: Family Medicine

## 2017-01-14 ENCOUNTER — Encounter: Payer: Self-pay | Admitting: Family Medicine

## 2017-01-14 DIAGNOSIS — M79644 Pain in right finger(s): Secondary | ICD-10-CM

## 2017-01-14 NOTE — Telephone Encounter (Signed)
OK to reorder T3's. Referral to hand placed. TY.

## 2017-01-16 ENCOUNTER — Telehealth: Payer: Self-pay | Admitting: Family Medicine

## 2017-01-16 NOTE — Telephone Encounter (Signed)
Relation to AG:TXMI Call back [phone] Pharmacy: [facility] [place] [facility] [place] [phone] (Phone) [phone] (Fax    Reason for call:  Patient requesting acetaminophen-codeine (TYLENOL #3) 300-30 MG tablet, stating she's done to 1 tablet, please advise

## 2017-01-17 ENCOUNTER — Ambulatory Visit: Payer: 59 | Admitting: Family Medicine

## 2017-01-17 MED ORDER — ACETAMINOPHEN-CODEINE #3 300-30 MG PO TABS: ORAL_TABLET | ORAL | 1 refills | Status: DC

## 2017-01-17 NOTE — Telephone Encounter (Signed)
Rx approved and faxed to the pharmacy.  Confirmation received.   Pt aware.//AB/CMA

## 2017-01-17 NOTE — Telephone Encounter (Signed)
Rx approved and faxed to the pharmacy.  Confirmation received.  Pt aware.//AB/CMA

## 2017-01-21 ENCOUNTER — Ambulatory Visit (INDEPENDENT_AMBULATORY_CARE_PROVIDER_SITE_OTHER): Payer: 59 | Admitting: Orthopaedic Surgery

## 2017-01-21 ENCOUNTER — Ambulatory Visit (INDEPENDENT_AMBULATORY_CARE_PROVIDER_SITE_OTHER): Payer: 59

## 2017-01-21 DIAGNOSIS — M79641 Pain in right hand: Secondary | ICD-10-CM

## 2017-01-21 MED ORDER — PREDNISONE 10 MG (21) PO TBPK: ORAL_TABLET | ORAL | 0 refills | Status: DC

## 2017-01-21 NOTE — Addendum Note (Signed)
Addended by: Precious Bard on: 01/21/2017 11:09 AM   Modules accepted: Orders

## 2017-01-21 NOTE — Progress Notes (Signed)
Office Visit Note   Patient: Heather Maldonado           Date of Birth: Oct 15, 1960           MRN: [id number] Visit Date: 01/21/2017              Requested by: Shelda Pal, [facility] [place], [place] [address] PCP: Shelda Pal, DO   Assessment & Plan: Visit Diagnoses:  1. 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 Arthritis panel obtained today to rule out inflammatory arthritis. Prednisone taper was prescribed today. If not better we'll also consider nerve conduction studies. Questions encouraged and answered. Total face to face encounter time was greater than 45 minutes and over half of this time was spent in counseling and/or coordination of care.  Follow-Up Instructions: Return if symptoms worsen or fail to improve.   Orders:  Orders Placed This Encounter  Procedures  . XR Hand Complete Right   Meds ordered this encounter  Medications  . predniSONE (STERAPRED UNI-PAK 21 TAB) 10 MG (21) TBPK tablet    Sig: Take as directed    Dispense:  21 tablet    Refill:  0      Procedures: No procedures performed   Clinical Data: No additional findings.   Subjective: Chief Complaint  Patient presents with  . Right Hand - Pain, Numbness, Tingling    Patient is a healthy 56 year old female with right hand numbness and tingling and pain with swelling of her middle finger. She feels like the finger is constantly jammed. She was using her hand quite a bit recently by pulling staples out of a roof. After that the hand swelled up especially the middle finger. She denies any history of gout.    Review of Systems  Constitutional: Negative.   HENT: Negative.   Eyes: Negative.   Respiratory: Negative.   Cardiovascular: Negative.   Endocrine: Negative.   Musculoskeletal: Negative.   Neurological: Negative.   Hematological: Negative.   Psychiatric/Behavioral:  Negative.   All other systems reviewed and are negative.    Objective: Vital Signs: There were no vitals taken for this visit.  Physical Exam  Constitutional: She is oriented to person, place, and time. She appears well-developed and well-nourished.  HENT:  Head: Normocephalic and atraumatic.  Eyes: EOM are normal.  Neck: Neck supple.  Pulmonary/Chest: Effort normal.  Abdominal: Soft.  Neurological: She is alert and oriented to person, place, and time.  Skin: Skin is warm. Capillary refill takes less than 2 seconds.  Psychiatric: She has a normal mood and affect. Her behavior is normal. Judgment and thought content normal.  Nursing note and vitals reviewed.   Ortho Exam Right hand exam shows mild to moderate swelling of mainly the middle finger. She does have loss of range of motion of the PIP and MCP joint. There is no skin changes or redness or cellulitis. There is no triggering of the finger. Negative carpal tunnel compressive signs. Specialty Comments:  No specialty comments available.  Imaging: No results found.   PMFS History: Patient Active Problem List   Diagnosis Date Noted  . Fibromyalgia 07/05/2016  . Migraine headache with aura 05/22/2016   Past Medical History:  Diagnosis Date  . Colon polyp   . Headache   . History of chicken pox     Family History  Problem Relation Age of Onset  .  Cancer Maternal Aunt        Breast    Past Surgical History:  Procedure Laterality Date  . CESAREAN SECTION  1985  . ELBOW SURGERY Right 2004   tendon   . ENDOMETRIAL ABLATION  2011  . SESMOIDECTOMY Right 2005   right big toe  . TUBAL LIGATION  1997  . ureter repair  1990   kink in ureter   Social History   Occupational History  . Not on file.   Social History Main Topics  . Smoking status: Never Smoker  . Smokeless tobacco: Never Used  . Alcohol use Yes  . Drug use: No  . Sexual activity: Not on file

## 2017-01-22 LAB — SEDIMENTATION RATE: Sed Rate: 1 mm/hr (ref 0–30)

## 2017-01-22 LAB — ANA: Anti Nuclear Antibody(ANA): NEGATIVE

## 2017-01-22 LAB — RHEUMATOID FACTOR: Rhuematoid fact SerPl-aCnc: <14 IU/mL (ref <14)

## 2017-01-22 LAB — URIC ACID: URIC ACID, SERUM: 5.6 mg/dL (ref 2.5–7.0)

## 2017-01-27 ENCOUNTER — Encounter: Payer: Self-pay | Admitting: Family Medicine

## 2017-01-27 ENCOUNTER — Encounter (INDEPENDENT_AMBULATORY_CARE_PROVIDER_SITE_OTHER): Payer: Self-pay | Admitting: Orthopaedic Surgery

## 2017-01-28 ENCOUNTER — Other Ambulatory Visit: Payer: Self-pay | Admitting: Family Medicine

## 2017-01-28 DIAGNOSIS — G8929 Other chronic pain: Secondary | ICD-10-CM

## 2017-01-28 DIAGNOSIS — R51 Headache: Principal | ICD-10-CM

## 2017-01-28 DIAGNOSIS — R519 Headache, unspecified: Secondary | ICD-10-CM

## 2017-01-28 NOTE — Progress Notes (Signed)
Order placed for neurology referral. Pt notified via [place].

## 2017-01-29 ENCOUNTER — Encounter: Payer: Self-pay | Admitting: Family Medicine

## 2017-01-29 NOTE — Telephone Encounter (Signed)
Heather Maldonado---will you look into this please?

## 2017-02-03 ENCOUNTER — Encounter: Payer: Self-pay | Admitting: Neurology

## 2017-02-03 ENCOUNTER — Ambulatory Visit (INDEPENDENT_AMBULATORY_CARE_PROVIDER_SITE_OTHER): Payer: 59 | Admitting: Neurology

## 2017-02-03 VITALS — BP 148/88 | HR 53 | Ht 66.0 in | Wt 158.5 lb

## 2017-02-03 DIAGNOSIS — G43019 Migraine without aura, intractable, without status migrainosus: Secondary | ICD-10-CM | POA: Diagnosis not present

## 2017-02-03 DIAGNOSIS — M797 Fibromyalgia: Secondary | ICD-10-CM | POA: Diagnosis not present

## 2017-02-03 HISTORY — DX: Migraine without aura, intractable, without status migrainosus: G43.019

## 2017-02-03 MED ORDER — RIZATRIPTAN BENZOATE 10 MG PO TABS: 10.0000 mg | ORAL_TABLET | Freq: Three times a day (TID) | ORAL | 3 refills | Status: DC | PRN

## 2017-02-03 MED ORDER — DULOXETINE HCL 30 MG PO CPEP: 30.0000 mg | ORAL_CAPSULE | Freq: Two times a day (BID) | ORAL | 3 refills | Status: DC

## 2017-02-03 NOTE — Patient Instructions (Signed)
    With the Cymbalta 30 mg, start at one a day for 2 weeks, then take one twice a day.  Try maxalt 10 mg up to 3 a day if needed for the headache.  Cymbalta (duloxetine) is an antidepressant medication that is commonly used for peripheral neuropathy pain or for fibromyalgia pain. As with any antidepressant medication, worsening depression can be seen. This medication can potentially cause headache, dizziness, sexual dysfunction, or nausea. If any problems are noted on this medication, please contact our office.

## 2017-02-03 NOTE — Progress Notes (Signed)
Reason for visit: Migraine headache  Referring physician: Dr. Hart Robinsons is a 56 y.o. female  History of present illness:  Heather Maldonado is a 56 year old right-handed white female with a history of headaches that date back about 2 years. The patient claims that she did not have headaches growing up, she does not relate her headaches to any medication changes or any life stressors. The patient is now having on average 4 headaches a week, the headaches last 2 or 3 hours and usually begin in the frontal areas bilaterally. The patient does have fibromyalgia and some chronic right shoulder pain associated with this, but she indicates that her headaches do not come up from the neck. She may have some nausea and vomiting, she may have irritability with the headache. She has noted that if she can get to sleep this will help the headache. She may have phonophobia and some mild photophobia with the headache. Occasionally there may be some dizziness. She takes Tylenol #3 for the headache and she may take some naproxen. She had been on amitriptyline for almost 12 years, she went off this medication in 2015. The patient has had a CT scan of the head because the headaches on 05/12/2015, this study was unremarkable. The patient denies any allergy symptoms or sinus drainage. She does not note any particular activating factors for the headache. She comes to this office for further evaluation. She denies any family history of headache.  Past Medical History:  Diagnosis Date  . Colon polyp   . Fibromyalgia   . Headache   . History of chicken pox     Past Surgical History:  Procedure Laterality Date  . CESAREAN SECTION  1985  . ELBOW SURGERY Right 2004   tendon   . ENDOMETRIAL ABLATION  2011  . SESMOIDECTOMY Right 2005   right big toe  . TUBAL LIGATION  1997  . ureter repair  1990   kink in ureter    Family History  Problem Relation Age of Onset  . Cancer Maternal Aunt        Breast     Social history:  reports that she has never smoked. She has never used smokeless tobacco. She reports that she drinks alcohol. She reports that she does not use drugs.  Medications:  Prior to Admission medications   Medication Sig Start Date End Date Taking? Authorizing Provider  acetaminophen-codeine (TYLENOL #3) 300-30 MG tablet Take 1 tablet by mouth every 8 hours as needed for headaches. 01/17/17  Yes Shelda Pal, DO  EPINEPHrine (EPIPEN 2-PAK) 0.3 mg/0.3 mL IJ SOAJ injection Inject in case of anaphylaxis. 11/29/16  Yes [place], Crosby Oyster, DO  GINSENG PO Take 100 mg by mouth daily.   Yes [provider]  LORATADINE PO Take 10 mg by mouth daily.   Yes [provider]  MAGNESIUM PO Take 500 mg by mouth daily.   Yes [provider]  MELATONIN PO Take 5 mg by mouth at bedtime.   Yes [provider]  meloxicam (MOBIC) 7.5 MG tablet Take 1 tablet (7.5 mg total) by mouth daily. 08/19/16  Yes Shelda Pal, DO  Multiple Vitamins-Minerals (MULTIVITAMIN PO) Take by mouth daily. 2 gummies   Yes [provider]  NAPROXEN PO Take 600 mg by mouth every morning.   Yes [provider]  Testosterone 20 % CREA 1 Dose by Does not apply route 3 (three) times a week.   Yes [provider]      Allergies  Allergen Reactions  . Bee Venom Anaphylaxis    ROS:  Out of a complete 14 system review of symptoms, the patient complains only of the following symptoms, and all other reviewed systems are negative.  Joint swelling, muscle cramps, aching muscles Headache, numbness, weakness, dizziness Decreased energy, change in appetite, racing thoughts Insomnia, sleepiness, restless legs  Blood pressure (!) 148/88, pulse (!) 53, height 5\' 6"  (1.676 m), weight 158 lb 8 oz (71.9 kg).  Physical Exam  General: The patient is alert and cooperative at the time of the examination.  Eyes: Pupils are equal, round, and reactive  to light. Discs are flat bilaterally.  Neck: The neck is supple, no carotid bruits are noted.  Respiratory: The respiratory examination is clear.  Cardiovascular: The cardiovascular examination reveals a regular rate and rhythm, no obvious murmurs or rubs are noted.  Neuromuscular: Range of movement of the cervical spine lacks about 15 of full lateral rotation bilaterally. There is no crepitus in the temporomandibular joints with jaw opening and closure.  Skin: Extremities are without significant edema.  Neurologic Exam  Mental status: The patient is alert and oriented x 3 at the time of the examination. The patient has apparent normal recent and remote memory, with an apparently normal attention span and concentration ability.  Cranial nerves: Facial symmetry is present. There is good sensation of the face to pinprick and soft touch bilaterally. The strength of the facial muscles and the muscles to head turning and shoulder shrug are normal bilaterally. Speech is well enunciated, no aphasia or dysarthria is noted. Extraocular movements are full. Visual fields are full. The tongue is midline, and the patient has symmetric elevation of the soft palate. No obvious hearing deficits are noted.  Motor: The motor testing reveals 5 over 5 strength of all 4 extremities. Good symmetric motor tone is noted throughout.  Sensory: Sensory testing is intact to pinprick, soft touch, vibration sensation, and position sense on all 4 extremities. No evidence of extinction is noted.  Coordination: Cerebellar testing reveals good finger-nose-finger and heel-to-shin bilaterally.  Gait and station: Gait is normal. Tandem gait is normal. Romberg is negative. No drift is seen.  Reflexes: Deep tendon reflexes are symmetric and normal bilaterally. Toes are downgoing bilaterally.   Assessment/Plan:  1. Common migraine headache, intractable  2. Fibromyalgia  The patient will be placed on Cymbalta hopefully  helping both the fibromyalgia and the migraine. We will work up to 30 mg twice daily. The patient will be given a prescription for Maxalt to take if needed for the headache. She will call for any dose adjustments of the medications, she will follow-up in 3 months.  Jill Alexanders MD 02/03/2017 12:05 PM  [facility] [address] [place], [place] [address]  Phone [phone] Fax [phone]

## 2017-02-04 ENCOUNTER — Encounter: Payer: Self-pay | Admitting: Neurology

## 2017-02-05 ENCOUNTER — Encounter: Payer: Self-pay | Admitting: Neurology

## 2017-03-25 ENCOUNTER — Telehealth: Payer: Self-pay | Admitting: Adult Health

## 2017-03-25 NOTE — Telephone Encounter (Signed)
FYI-I scheduled patient an appointment with [name] on 03-26-17 at 2:30 to follow up for fibromyalgia per a message I received to call and schedule. She has a follow up appointment with [facility] on 05-08-17 which I did not cancel.Heather Maldonado

## 2017-03-26 ENCOUNTER — Ambulatory Visit (INDEPENDENT_AMBULATORY_CARE_PROVIDER_SITE_OTHER): Payer: 59 | Admitting: Adult Health

## 2017-03-26 ENCOUNTER — Encounter: Payer: Self-pay | Admitting: Adult Health

## 2017-03-26 VITALS — BP 131/88 | HR 68 | Ht 66.0 in | Wt 158.4 lb

## 2017-03-26 DIAGNOSIS — G43109 Migraine with aura, not intractable, without status migrainosus: Secondary | ICD-10-CM | POA: Diagnosis not present

## 2017-03-26 DIAGNOSIS — M797 Fibromyalgia: Secondary | ICD-10-CM | POA: Diagnosis not present

## 2017-03-26 MED ORDER — DULOXETINE HCL 30 MG PO CPEP: ORAL_CAPSULE | ORAL | 5 refills | Status: DC

## 2017-03-26 MED FILL — DULoxetine HCL 30 MG CPEP: 30 | 30 days supply | Qty: 90 | Fill #0

## 2017-03-26 NOTE — Progress Notes (Signed)
PATIENT: Heather Maldonado DOB: 1961/06/21  REASON FOR VISIT: follow up HISTORY FROM: patient  HISTORY OF PRESENT ILLNESS: Today 03/26/17 [name] is a 56 year old female with a history of headaches and fibromyalgia. She returns today for follow-up. At the last visit she was placed on Cymbalta 30 mg twice a day. She reports initially she had a hard time tolerating the medication as she felt it made her headaches worse. However now she states that her headaches fatigue and nausea are much better. She states started about 2 weeks ago she began having pain in the middle back and across the shoulders. She feels this is related to her fibromyalgia. She is also having some itching on the hands and very small sores on the hands. She feels this may be related to Cymbalta. However she been on Cymbalta since August and the symptoms just started 2 weeks ago. She returns today for an evaluation.  HISTORY 02/03/17: [name] is a 56 year old right-handed white female with a history of headaches that date back about 2 years. The patient claims that she did not have headaches growing up, she does not relate her headaches to any medication changes or any life stressors. The patient is now having on average 4 headaches a week, the headaches last 2 or 3 hours and usually begin in the frontal areas bilaterally. The patient does have fibromyalgia and some chronic right shoulder pain associated with this, but she indicates that her headaches do not come up from the neck. She may have some nausea and vomiting, she may have irritability with the headache. She has noted that if she can get to sleep this will help the headache. She may have phonophobia and some mild photophobia with the headache. Occasionally there may be some dizziness. She takes Tylenol #3 for the headache and she may take some naproxen. She had been on amitriptyline for almost 12 years, she went off this medication in 2015. The patient has had a CT scan of  the head because the headaches on 05/12/2015, this study was unremarkable. The patient denies any allergy symptoms or sinus drainage. She does not note any particular activating factors for the headache. She comes to this office for further evaluation. She denies any family history of headache.  REVIEW OF SYSTEMS: Out of a complete 14 system review of symptoms, the patient complains only of the following symptoms, and all other reviewed systems are negative.  See HPI  ALLERGIES: Allergies  Allergen Reactions  . Bee Venom Anaphylaxis    HOME MEDICATIONS: Outpatient Medications Prior to Visit  Medication Sig Dispense Refill  . acetaminophen-codeine (TYLENOL #3) 300-30 MG tablet Take 1 tablet by mouth every 8 hours as needed for headaches. 30 tablet 1  . DULoxetine (CYMBALTA) 30 MG capsule Take 1 capsule (30 mg total) by mouth 2 (two) times daily. 60 capsule 3  . EPINEPHrine (EPIPEN 2-PAK) 0.3 mg/0.3 mL IJ SOAJ injection Inject in case of anaphylaxis. 2 Device 3  . Multiple Vitamins-Minerals (MULTIVITAMIN Maldonado) Take by mouth daily. 2 gummies    . NAPROXEN Maldonado Take 600 mg by mouth every morning.    . rizatriptan (MAXALT) 10 MG tablet Take 1 tablet (10 mg total) by mouth 3 (three) times daily as needed for migraine. 10 tablet 3  . Testosterone 20 % CREA 1 Dose by Does not apply route 3 (three) times a week.    Marland Kitchen GINSENG Maldonado Take 100 mg by mouth daily.    Marland Kitchen LORATADINE Maldonado  Take 10 mg by mouth daily.    Marland Kitchen MAGNESIUM Maldonado Take 500 mg by mouth daily.    Marland Kitchen MELATONIN Maldonado Take 5 mg by mouth at bedtime.    . meloxicam (MOBIC) 7.5 MG tablet Take 1 tablet (7.5 mg total) by mouth daily. (Patient not taking: Reported on 03/26/2017) 30 tablet 5   No facility-administered medications prior to visit.     PAST MEDICAL HISTORY: Past Medical History:  Diagnosis Date  . Colon polyp   . Common migraine with intractable migraine 02/03/2017  . Fibromyalgia   . Headache   . History of chicken pox     PAST SURGICAL  HISTORY: Past Surgical History:  Procedure Laterality Date  . CESAREAN SECTION  1985  . ELBOW SURGERY Right 2004   tendon   . ENDOMETRIAL ABLATION  2011  . SESMOIDECTOMY Right 2005   right big toe  . TUBAL LIGATION  1997  . ureter repair  1990   kink in ureter    FAMILY HISTORY: Family History  Problem Relation Age of Onset  . Cancer Maternal Aunt        Breast    SOCIAL HISTORY: Social History   Social History  . Marital status: Married    Spouse name: Matt  . Number of children: 2  . Years of education: 14   Occupational History  . [employer]    Social History Main Topics  . Smoking status: Never Smoker  . Smokeless tobacco: Never Used  . Alcohol use Yes  . Drug use: No  . Sexual activity: Not on file   Other Topics Concern  . Not on file   Social History Narrative   Lives with husband   Caffeine use: coffee daily- 3 cups   Right handed      PHYSICAL EXAM  Vitals:   03/26/17 1423  BP: 131/88  Pulse: 68  Weight: 158 lb 6.4 oz (71.8 kg)  Height: 5\' 6"  (1.676 m)   Body mass index is 25.57 kg/m.  Generalized: Well developed, in no acute distress   Neurological examination  Mentation: Alert oriented to time, place, history taking. Follows all commands speech and language fluent Cranial nerve II-XII: Pupils were equal round reactive to light. Extraocular movements were full, visual field were full on confrontational test. Facial sensation and strength were normal. Uvula tongue midline. Head turning and shoulder shrug  were normal and symmetric. Motor: The motor testing reveals 5 over 5 strength of all 4 extremities. Good symmetric motor tone is noted throughout.  Sensory: Sensory testing is intact to soft touch on all 4 extremities. No evidence of extinction is noted.  Coordination: Cerebellar testing reveals good finger-nose-finger and heel-to-shin bilaterally.  Gait and station: Gait is normal. Tandem gait is normal. Romberg is negative. No  drift is seen.  Reflexes: Deep tendon reflexes are symmetric and normal bilaterally.   DIAGNOSTIC DATA (LABS, IMAGING, TESTING) - I reviewed patient records, labs, notes, testing and imaging myself where available.  Lab Results  Component Value Date   WBC 7.2 06/21/2016   HGB 14.4 06/21/2016   HCT 42.0 06/21/2016   MCV 84.6 06/21/2016   PLT 249.0 06/21/2016      Component Value Date/Time   NA 140 06/21/2016 1644   K 4.2 06/21/2016 1644   CL 101 06/21/2016 1644   CO2 30 06/21/2016 1644   GLUCOSE 91 06/21/2016 1644   BUN 13 06/21/2016 1644   CREATININE 0.73 06/21/2016 1644   CALCIUM 10.0  06/21/2016 1644   PROT 7.6 06/21/2016 1644   ALBUMIN 4.8 06/21/2016 1644   AST 24 06/21/2016 1644   ALT 23 06/21/2016 1644   ALKPHOS 74 06/21/2016 1644   BILITOT 0.7 06/21/2016 1644   Lab Results  Component Value Date   CHOL 231 (H) 06/21/2016   HDL 82.10 06/21/2016   LDLCALC 131 (H) 06/21/2016   TRIG 91.0 06/21/2016   CHOLHDL 3 06/21/2016   No results found for: HGBA1C No results found for: VITAMINB12 Lab Results  Component Value Date   TSH 1.44 05/22/2016      ASSESSMENT AND PLAN 56 y.o. year old female  has a past medical history of Colon polyp; Common migraine with intractable migraine (02/03/2017); Fibromyalgia; Headache; and History of chicken pox. here with:  1. Fibromyalgia 2. Headaches  I advised the patient that the itching and sores on her hands seem unlikely to be caused by Cymbalta. However I did advise that we could potentially take her off the medication to see if her symptoms subside. Patient states that she does not want to have a recurrence of her headaches nausea and fatigue. The patient is concerned about pain in the middle of the back and across the shoulders. I advised that we could increase Cymbalta to see if this is beneficial. She is amenable to this plan. Also advised that with fibromyalgia stretching exercises such as yoga or Pilates would be of good  benefit for her. Patient voices understanding. She will try these things and follow-up in November. If her symptoms worsen or if the itching and sores on the hands worsen we could potentially take her off of Cymbalta. She voiced understanding.     Ward Givens, MSN, NP-C 03/26/2017, 2:38 PM [facility] [address] [place], [place] [address] [phone]

## 2017-03-26 NOTE — Progress Notes (Signed)
I have read the note, and I agree with the clinical assessment and plan.  WILLIS,CHARLES KEITH   

## 2017-03-26 NOTE — Patient Instructions (Addendum)
Your Plan:  Increase Cymbalta to 1 tablet in the morning and two tablets at bedtime Try to participate is stretching exercises- Yoga, Pilates If your symptoms worsen or you develop new symptoms please let us know.   Thank you for coming to see [place] at [facility]. I hope we have been able to provide you high quality care today.  You may receive a patient satisfaction survey over the next few weeks. We would appreciate your feedback and comments so that we may continue to improve ourselves and the health of our patients.  Duloxetine delayed-release capsules What is this medicine? DULOXETINE (doo LOX e teen) is used to treat depression, anxiety, and different types of chronic pain. This medicine may be used for other purposes; ask your health care provider or pharmacist if you have questions. COMMON BRAND NAME(S): Cymbalta, [place] What should I tell my health care provider before I take this medicine? They need to know if you have any of these conditions: -bipolar disorder or a family history of bipolar disorder -glaucoma -kidney disease -liver disease -suicidal thoughts or a previous suicide attempt -taken medicines called MAOIs like Carbex, Eldepryl, Marplan, Nardil, and Parnate within 14 days -an unusual reaction to duloxetine, other medicines, foods, dyes, or preservatives -pregnant or trying to get pregnant -breast-feeding How should I use this medicine? Take this medicine by mouth with a glass of water. Follow the directions on the prescription label. Do not cut, crush or chew this medicine. You can take this medicine with or without food. Take your medicine at regular intervals. Do not take your medicine more often than directed. Do not stop taking this medicine suddenly except upon the advice of your doctor. Stopping this medicine too quickly may cause serious side effects or your condition may worsen. A special MedGuide will be given to you by the pharmacist with  each prescription and refill. Be sure to read this information carefully each time. Talk to your pediatrician regarding the use of this medicine in children. While this drug may be prescribed for children as young as 50 years of age for selected conditions, precautions do apply. Overdosage: If you think you have taken too much of this medicine contact a poison control center or emergency room at once. NOTE: This medicine is only for you. Do not share this medicine with others. What if I miss a dose? If you miss a dose, take it as soon as you can. If it is almost time for your next dose, take only that dose. Do not take double or extra doses. What may interact with this medicine? Do not take this medicine with any of the following medications: -desvenlafaxine -levomilnacipran -linezolid -MAOIs like Carbex, Eldepryl, Marplan, Nardil, and Parnate -methylene blue (injected into a vein) -milnacipran -thioridazine -venlafaxine This medicine may also interact with the following medications: -alcohol -amphetamines -aspirin and aspirin-like medicines -certain antibiotics like ciprofloxacin and enoxacin -certain medicines for blood pressure, heart disease, irregular heart beat -certain medicines for depression, anxiety, or psychotic disturbances -certain medicines for migraine headache like almotriptan, eletriptan, frovatriptan, naratriptan, rizatriptan, sumatriptan, zolmitriptan -certain medicines that treat or prevent blood clots like warfarin, enoxaparin, and dalteparin -cimetidine -fentanyl -lithium -NSAIDS, medicines for pain and inflammation, like ibuprofen or naproxen -phentermine -procarbazine -rasagiline -sibutramine -St. John's wort -theophylline -tramadol -tryptophan This list may not describe all possible interactions. Give your health care provider a list of all the medicines, herbs, non-prescription drugs, or dietary supplements you use. Also tell them if you smoke, drink  alcohol, or use illegal drugs. Some items may interact with your medicine. What should I watch for while using this medicine? Tell your doctor if your symptoms do not get better or if they get worse. Visit your doctor or health care professional for regular checks on your progress. Because it may take several weeks to see the full effects of this medicine, it is important to continue your treatment as prescribed by your doctor. Patients and their families should watch out for new or worsening thoughts of suicide or depression. Also watch out for sudden changes in feelings such as feeling anxious, agitated, panicky, irritable, hostile, aggressive, impulsive, severely restless, overly excited and hyperactive, or not being able to sleep. If this happens, especially at the beginning of treatment or after a change in dose, call your health care professional. Dennis Bast may get drowsy or dizzy. Do not drive, use machinery, or do anything that needs mental alertness until you know how this medicine affects you. Do not stand or sit up quickly, especially if you are an older patient. This reduces the risk of dizzy or fainting spells. Alcohol may interfere with the effect of this medicine. Avoid alcoholic drinks. This medicine can cause an increase in blood pressure. This medicine can also cause a sudden drop in your blood pressure, which may make you feel faint and increase the chance of a fall. These effects are most common when you first start the medicine or when the dose is increased, or during use of other medicines that can cause a sudden drop in blood pressure. Check with your doctor for instructions on monitoring your blood pressure while taking this medicine. Your mouth may get dry. Chewing sugarless gum or sucking hard candy, and drinking plenty of water may help. Contact your doctor if the problem does not go away or is severe. What side effects may I notice from receiving this medicine? Side effects that you  should report to your doctor or health care professional as soon as possible: -allergic reactions like skin rash, itching or hives, swelling of the face, lips, or tongue -anxious -breathing problems -confusion -changes in vision -chest pain -confusion -elevated mood, decreased need for sleep, racing thoughts, impulsive behavior -eye pain -fast, irregular heartbeat -feeling faint or lightheaded, falls -feeling agitated, angry, or irritable -hallucination, loss of contact with reality -high blood pressure -loss of balance or coordination -palpitations -redness, blistering, peeling or loosening of the skin, including inside the mouth -restlessness, pacing, inability to keep still -seizures -stiff muscles -suicidal thoughts or other mood changes -trouble passing urine or change in the amount of urine -trouble sleeping -unusual bleeding or bruising -unusually weak or tired -vomiting -yellowing of the eyes or skin Side effects that usually do not require medical attention (report to your doctor or health care professional if they continue or are bothersome): -change in sex drive or performance -change in appetite or weight -constipation -dizziness -dry mouth -headache -increased sweating -nausea -tired This list may not describe all possible side effects. Call your doctor for medical advice about side effects. You may report side effects to FDA at 1-800-FDA-1088.  Where should I keep my medicine? Keep out of the reach of children. Store at room temperature between 20 and 25 degrees C (68 to 77 degrees F). Throw away any unused medicine after the expiration date. NOTE: This sheet is a summary. It may not cover all possible information. If you have questions about this medicine, talk to your doctor, pharmacist, or health care provider.  2018 Elsevier/Gold Standard (2015-11-09 18:16:03)

## 2017-04-07 ENCOUNTER — Encounter: Payer: Self-pay | Admitting: Family Medicine

## 2017-04-07 MED ORDER — ACETAMINOPHEN-CODEINE #3 300-30 MG PO TABS: ORAL_TABLET | ORAL | 1 refills | Status: DC

## 2017-04-07 NOTE — Telephone Encounter (Signed)
OK to refill, will plan on deferring to Neurology for future refills if they deem warranted. TY.

## 2017-04-30 ENCOUNTER — Telehealth: Payer: Self-pay | Admitting: *Deleted

## 2017-04-30 NOTE — Telephone Encounter (Signed)
[facility] pharmacy to discuss fax received for duloxetine not covered. Spoke with Butch Penny who stated if one Rx is written for Duloxetine 30 mg capsules, take one in morning and a second Rx is written for Duloxetine 60 mg capsules, take one in the evening, the medication will be approved by insurance.  Will inform NP. New Rx to be sent to [facility].

## 2017-05-01 MED ORDER — DULOXETINE HCL 60 MG PO CPEP: 60.0000 mg | ORAL_CAPSULE | Freq: Every day | ORAL | 5 refills | Status: DC

## 2017-05-01 MED ORDER — DULOXETINE HCL 30 MG PO CPEP: 30.0000 mg | ORAL_CAPSULE | Freq: Every morning | ORAL | 5 refills | Status: DC

## 2017-05-01 NOTE — Telephone Encounter (Signed)
Order sent.

## 2017-05-04 ENCOUNTER — Encounter: Payer: Self-pay | Admitting: Adult Health

## 2017-05-05 ENCOUNTER — Telehealth: Payer: Self-pay | Admitting: *Deleted

## 2017-05-05 NOTE — Telephone Encounter (Signed)
See other note

## 2017-05-05 NOTE — Telephone Encounter (Signed)
Received my chart message from patient asking if her 05/07/17 follow up be moved to today if possible. This RN replied to her message that an 11:30 am is open today, will be placed on hold. Requested she call asap after 8 am and reschedule with phone staff if she can come today.

## 2017-05-05 NOTE — Telephone Encounter (Signed)
Called patient who stated she was told her Cymbalta had to be in 2 prescriptions for insurance to approve. This RN confirmed that information. Patient asked if refills had been sent in; this RN confirmed that as well. Patient stated she will come for FU on Thursday. She verbalized understanding, appreciation of call.

## 2017-05-05 NOTE — Telephone Encounter (Signed)
Patient is calling to discuss Rx that was sent to [facility] outpatient pharmacy for DULoxetine (CYMBALTA) 30 MG capsule and DULoxetine (CYMBALTA) 60 MG capsule. She needs to discuss dosage.

## 2017-05-08 ENCOUNTER — Ambulatory Visit: Payer: 59 | Admitting: Adult Health

## 2017-05-18 ENCOUNTER — Encounter: Payer: Self-pay | Admitting: Family Medicine

## 2017-05-21 ENCOUNTER — Other Ambulatory Visit: Payer: Self-pay | Admitting: Family Medicine

## 2017-05-21 ENCOUNTER — Ambulatory Visit (INDEPENDENT_AMBULATORY_CARE_PROVIDER_SITE_OTHER): Payer: 59 | Admitting: Family Medicine

## 2017-05-21 ENCOUNTER — Encounter: Payer: Self-pay | Admitting: Family Medicine

## 2017-05-21 VITALS — BP 120/74 | HR 76 | Temp 98.1°F | Ht 66.0 in | Wt 157.2 lb

## 2017-05-21 DIAGNOSIS — H1032 Unspecified acute conjunctivitis, left eye: Secondary | ICD-10-CM | POA: Diagnosis not present

## 2017-05-21 DIAGNOSIS — J029 Acute pharyngitis, unspecified: Secondary | ICD-10-CM | POA: Diagnosis not present

## 2017-05-21 LAB — POCT RAPID STREP A (OFFICE): RAPID STREP A SCREEN: NEGATIVE

## 2017-05-21 MED ORDER — ACETAMINOPHEN-CODEINE #3 300-30 MG PO TABS: 1.0000 | ORAL_TABLET | Freq: Three times a day (TID) | ORAL | 0 refills | Status: DC | PRN

## 2017-05-21 MED ORDER — ACETAMINOPHEN-CODEINE #3 300-30 MG PO TABS: ORAL_TABLET | ORAL | 0 refills | Status: DC

## 2017-05-21 MED ORDER — BENZONATATE 100 MG PO CAPS: 100.0000 mg | ORAL_CAPSULE | Freq: Three times a day (TID) | ORAL | 0 refills | Status: DC | PRN

## 2017-05-21 MED ORDER — POLYMYXIN B-TRIMETHOPRIM 10000-0.1 UNIT/ML-% OP SOLN: 2.0000 [drp] | Freq: Four times a day (QID) | OPHTHALMIC | 0 refills | Status: DC

## 2017-05-21 NOTE — Progress Notes (Signed)
SUBJECTIVE:   Heather Maldonado is a 56 y.o. female presents to the clinic for:  Chief Complaint  Patient presents with  . Sore Throat   Complains of sore throat for 6 days.  Other associated symptoms: L eye drainage/redness, ear fullness, sore throat and cough.  Denies: sinus congestion, sinus pain, rhinorrhea, ear drainage, wheezing, shortness of breath, myalgia and fevers/rigors Sick Contacts: daughter sick with same thing Therapy to date: Nyquil, Theraflu  Social History   Tobacco Use  Smoking Status Never Smoker  Smokeless Tobacco Never Used    ROS: Pertinent items are noted in HPI  Patient's medications, allergies, past medical, surgical, social and family histories were reviewed and updated as appropriate.  OBJECTIVE:  BP 120/74 (BP Location: Left Arm, Patient Position: Sitting, Cuff Size: Normal)   Pulse 76   Temp 98.1 F (36.7 C) (Oral)   Ht 5\' 6"  (1.676 m)   Wt 157 lb 4 oz (71.3 kg)   SpO2 97%   BMI 25.38 kg/m  General: Awake, alert, appearing stated age Eyes: conjunctivae and sclerae clear Ears: normal TMs bilaterally Nose: no visible exudate Oropharynx: lips, mucosa, and tongue normal; teeth and gums normal  Neck: supple, no significant adenopathy Lungs: clear to auscultation, no wheezes, rales or rhonchi, symmetric air entry, normal effort Heart: rate and rhythm regular Skin:reveals no rash Psych: Age appropriate judgment and insight  ASSESSMENT/PLAN:  Acute conjunctivitis of left eye, unspecified acute conjunctivitis type - Plan: trimethoprim-polymyxin b (POLYTRIM) ophthalmic solution  Viral pharyngitis - Plan: benzonatate (TESSALON) 100 MG capsule, POCT rapid strep A  Orders as above. Continue to practice good hand hygiene and push fluids. Ibuprofen and acetaminophen for pain. Also discussed T3 usage. She is now on Cymbalta from Neuro and is doing much better on this. She is using 1-2 per month again, which I am OK with.  F/u prn. Pt voiced  understanding and agreement to the plan.  [place], DO 05/21/17 3:48 PM

## 2017-05-21 NOTE — Progress Notes (Signed)
Pre visit review using our clinic review tool, if applicable. No additional management support is needed unless otherwise documented below in the visit note. 

## 2017-05-21 NOTE — Patient Instructions (Addendum)
Your Rapid Strep test is negative.  Artificial tears like Refresh and Systane may be used for comfort. OK to get generic version. Generally people use them every 2-4 hours, but you can use them as much as you want because there is no medication in it.  Ibuprofen 400-600 mg (2-3 over the counter strength tabs) every 6 hours as needed for pain.  OK to take Tylenol 1000 mg (2 extra strength tabs) or 975 mg (3 regular strength tabs) every 6 hours as needed.  Continue to push fluids, practice good hand hygiene, and cover your mouth if you cough.  If you start having fevers, shaking or shortness of breath, seek immediate care.  Let us know if you need anything.

## 2017-05-26 ENCOUNTER — Encounter: Payer: Self-pay | Admitting: Adult Health

## 2017-05-27 ENCOUNTER — Other Ambulatory Visit: Payer: Self-pay | Admitting: *Deleted

## 2017-05-27 MED ORDER — DULOXETINE HCL 30 MG PO CPEP: 30.0000 mg | ORAL_CAPSULE | Freq: Every morning | ORAL | 5 refills | Status: DC

## 2017-05-27 MED ORDER — DULOXETINE HCL 60 MG PO CPEP: 60.0000 mg | ORAL_CAPSULE | Freq: Every day | ORAL | 5 refills | Status: DC

## 2017-05-27 MED FILL — DULoxetine HCL 60 MG CPEP: 60 | 30 days supply | Qty: 30 | Fill #0

## 2017-05-27 MED FILL — DULoxetine HCL 30 MG CPEP: 30 | 30 days supply | Qty: 30 | Fill #0

## 2017-06-19 ENCOUNTER — Encounter: Payer: Self-pay | Admitting: Adult Health

## 2017-06-19 ENCOUNTER — Telehealth: Payer: Self-pay | Admitting: *Deleted

## 2017-06-19 NOTE — Telephone Encounter (Signed)
Pt changed her mind did not want her form filled out.

## 2017-06-25 MED FILL — DULoxetine HCL 30 MG CPEP: 30 | 30 days supply | Qty: 30 | Fill #1

## 2017-06-25 MED FILL — DULoxetine HCL 60 MG CPEP: 60 | 30 days supply | Qty: 30 | Fill #1

## 2017-06-30 MED FILL — SHIPPING COST: 1 days supply | Qty: 1 | Fill #0

## 2017-07-28 MED FILL — DULoxetine HCL 60 MG CPEP: 60 | 30 days supply | Qty: 30 | Fill #2

## 2017-07-28 MED FILL — DULoxetine HCL 30 MG CPEP: 30 | 30 days supply | Qty: 30 | Fill #2

## 2017-07-28 MED FILL — SHIPPING COST: 1 days supply | Qty: 1 | Fill #1

## 2017-08-01 DIAGNOSIS — Z1231 Encounter for screening mammogram for malignant neoplasm of breast: Secondary | ICD-10-CM | POA: Diagnosis not present

## 2017-08-11 ENCOUNTER — Encounter: Payer: Self-pay | Admitting: Obstetrics & Gynecology

## 2017-08-14 ENCOUNTER — Encounter: Payer: Self-pay | Admitting: Family Medicine

## 2017-08-27 MED FILL — DULoxetine HCL 30 MG CPEP: 30 | 30 days supply | Qty: 30 | Fill #3

## 2017-08-27 MED FILL — DULoxetine HCL 60 MG CPEP: 60 | 30 days supply | Qty: 30 | Fill #3

## 2017-08-28 MED FILL — SHIPPING COST: 1 days supply | Qty: 1 | Fill #2

## 2017-09-27 MED FILL — DULoxetine HCL 30 MG CPEP: 30 | 30 days supply | Qty: 30 | Fill #4

## 2017-09-27 MED FILL — DULoxetine HCL 60 MG CPEP: 60 | 30 days supply | Qty: 30 | Fill #4

## 2017-10-01 MED FILL — SHIPPING COST: 1 days supply | Qty: 1 | Fill #3

## 2017-10-03 ENCOUNTER — Ambulatory Visit (INDEPENDENT_AMBULATORY_CARE_PROVIDER_SITE_OTHER): Payer: 59 | Admitting: Obstetrics & Gynecology

## 2017-10-03 ENCOUNTER — Encounter: Payer: Self-pay | Admitting: Obstetrics & Gynecology

## 2017-10-03 ENCOUNTER — Other Ambulatory Visit: Payer: Self-pay

## 2017-10-03 VITALS — BP 126/80 | HR 84 | Resp 16 | Ht 66.0 in | Wt 153.0 lb

## 2017-10-03 DIAGNOSIS — Z01419 Encounter for gynecological examination (general) (routine) without abnormal findings: Secondary | ICD-10-CM | POA: Diagnosis not present

## 2017-10-03 NOTE — Progress Notes (Signed)
57 y.o. G1P1 MarriedCaucasianF here for annual exam.  Doing well.  Denies vaginal bleeding.  Reports she she feels much better now that she is on Cymbalta.  Is using this for fibromyalgia.    She is having night sweats as well.  This started after staring the Cymbalta.  Has really helped the pain from the fibromyalgia.    PCP:  Dr. Nani Ravens.  Did blood work in November.  Patient's last menstrual period was 06/25/2003 (approximate).          Sexually active: Yes.    The current method of family planning is tubal ligation.    Exercising: No.   Smoker:  no  Health Maintenance: Pap:  07/05/16 Neg. HR HPV:neg  History of abnormal Pap:  Yes, remote hx  MMG:  08/01/17 BIRADS1:Neg  Colonoscopy:  2015 polyps.  f/u 3 years.  Pt states she was told this was not 3 year follow up. BMD:   Never TDaP:  2014 Pneumonia vaccine(s):  n/a Shingrix:   No.  D/w pt Shingrix vaccine. Hep C testing:  Screening Labs: PCP   reports that she has never smoked. She has never used smokeless tobacco. She reports that she drinks alcohol. She reports that she does not use drugs.  Past Medical History:  Diagnosis Date  . Colon polyp   . Common migraine with intractable migraine 02/03/2017  . Fibromyalgia   . Headache   . History of chicken pox     Past Surgical History:  Procedure Laterality Date  . CESAREAN SECTION  1985  . ELBOW SURGERY Right 2004   tendon   . ENDOMETRIAL ABLATION  2011  . SESMOIDECTOMY Right 2005   right big toe  . TUBAL LIGATION  1997  . ureter repair  1990   kink in ureter    Current Outpatient Medications  Medication Sig Dispense Refill  . acetaminophen-codeine (TYLENOL #3) 300-30 MG tablet Take 1 tablet by mouth every 8 (eight) hours as needed for moderate pain. 30 tablet 0  . DULoxetine (CYMBALTA) 30 MG capsule Take 1 capsule (30 mg total) by mouth every morning. 30 capsule 5  . DULoxetine (CYMBALTA) 60 MG capsule Take 1 capsule (60 mg total) by mouth at bedtime. 30 capsule 5   . EPINEPHrine (EPIPEN 2-PAK) 0.3 mg/0.3 mL IJ SOAJ injection Inject in case of anaphylaxis. 2 Device 3  . Multiple Vitamins-Minerals (MULTIVITAMIN PO) Take by mouth daily. 2 gummies    . trimethoprim-polymyxin b (POLYTRIM) ophthalmic solution Place 2 drops into the left eye every 6 (six) hours. 10 mL 0  . Testosterone 20 % CREA 1 Dose by Does not apply route 3 (three) times a week.     No current facility-administered medications for this visit.     Family History  Problem Relation Age of Onset  . Cancer Maternal Aunt        Breast    Review of Systems  Genitourinary: Positive for frequency and urgency.       Loss of sexual interest Pain with intercourse   Endo/Heme/Allergies:       Craving ice Night sweats   All other systems reviewed and are negative.   Exam:   BP 126/80 (BP Location: Right Arm, Patient Position: Sitting, Cuff Size: Normal)   Pulse 84   Resp 16   Ht 5\' 6"  (1.676 m)   Wt 153 lb (69.4 kg)   LMP 06/25/2003 (Approximate)   BMI 24.69 kg/m    Height: 5\' 6"  (167.6 cm)  Ht Readings from Last 3 Encounters:  10/03/17 5\' 6"  (1.676 m)  05/21/17 5\' 6"  (1.676 m)  03/26/17 5\' 6"  (1.676 m)    General appearance: alert, cooperative and appears stated age Head: Normocephalic, without obvious abnormality, atraumatic Neck: no adenopathy, supple, symmetrical, trachea midline and thyroid normal to inspection and palpation Lungs: clear to auscultation bilaterally Breasts: normal appearance, no masses or tenderness Heart: regular rate and rhythm Abdomen: soft, non-tender; bowel sounds normal; no masses,  no organomegaly Extremities: extremities normal, atraumatic, no cyanosis or edema Skin: Skin color, texture, turgor normal. No rashes or lesions Lymph nodes: Cervical, supraclavicular, and axillary nodes normal. No abnormal inguinal nodes palpated Neurologic: Grossly normal   Pelvic: External genitalia:  no lesions              Urethra:  normal appearing urethra  with no masses, tenderness or lesions              Bartholins and Skenes: normal                 Vagina: normal appearing vagina with normal color and discharge, no lesions              Cervix: no lesions              Pap taken: No. Bimanual Exam:  Uterus:  normal size, contour, position, consistency, mobility, non-tender              Adnexa: normal adnexa and no mass, fullness, tenderness               Rectovaginal: Confirms               Anus:  normal sphincter tone, no lesions  Chaperone was present for exam.  A:  Well Woman with normal exam PMP, no HRT Hot flashes that started with Cymbalta dosing Insomnia that she feels is related to hot flashes H/O decreased libido, no improvement with topical testosterone Fibromyalgia, much improved with Cymbalta  P:   Mammogram guidelines reviewed pap smear with neg HR HPV 2018.  Pap smear not indicated today. Colonoscopy records signed for today so I can see when follow up is due D/w pt OTC sleep medication first.  If this doesn't help, may need to try restoril.  I do really think this is related to dosage of cymbalta.  Pt is going to discuss with PCP.  Feels so much better on Cymbalta, she is nervous about decreasing doage Return annually or prn

## 2017-10-06 ENCOUNTER — Telehealth: Payer: Self-pay | Admitting: Gastroenterology

## 2017-10-06 ENCOUNTER — Telehealth: Payer: Self-pay | Admitting: *Deleted

## 2017-10-06 NOTE — Telephone Encounter (Signed)
[facility]  Office is  Calling in regards to finding out when pt needs to schedule her next colonoscopy please call Cleotis Nipper at [phone]

## 2017-10-06 NOTE — Telephone Encounter (Signed)
Called [facility] re: patient's last colonoscopy and the date she needs to follow up.   Ginger states she will route message to provider's nurse to return call.

## 2017-10-09 NOTE — Telephone Encounter (Signed)
Please let her know this information and see if she needs help with scheduling this appt.  Thanks.

## 2017-10-09 NOTE — Telephone Encounter (Signed)
Left voicemail for  GI: Ginger to call back.

## 2017-10-09 NOTE — Telephone Encounter (Signed)
[facility] sent fax with patient's last colonoscopy and path report.  Follow up recommendations are not listed on either report.  Left voicemail to call back.

## 2017-10-09 NOTE — Telephone Encounter (Signed)
Ginger from [facility] called back. States patient was supposed to follow up in 1 year from last Colonoscopy 08/09/13. Patient was notified at the time but never called back to schedule.   Colonoscopy report to scan.  Dr. Sabra Heck please advise.

## 2017-10-09 NOTE — Telephone Encounter (Signed)
Colonoscopy report and pathology have been faxed to pt's PCP's office.

## 2017-10-13 ENCOUNTER — Telehealth: Payer: Self-pay | Admitting: Gastroenterology

## 2017-10-13 NOTE — Telephone Encounter (Signed)
Pt left vm to schedule colonoscopy  °

## 2017-10-13 NOTE — Telephone Encounter (Signed)
Patient notified. Verbalized understanding.  States she will call us back if she needs help scheduling.

## 2017-10-15 NOTE — Telephone Encounter (Signed)
Patient left another VM that she is past due for her colonoscopy. She stated it was due in 2016. Please call

## 2017-10-16 ENCOUNTER — Telehealth: Payer: Self-pay | Admitting: Gastroenterology

## 2017-10-16 ENCOUNTER — Encounter: Payer: Self-pay | Admitting: Obstetrics & Gynecology

## 2017-10-16 ENCOUNTER — Telehealth: Payer: Self-pay | Admitting: *Deleted

## 2017-10-16 NOTE — Telephone Encounter (Signed)
Spoke with patient. Patient states she has since received a call from Dr. Dorothey Baseman office, they are working on scheduling colonoscopy. Patient thankful for return call, advised to return call if any additional assistance needed.   Routing to covering provider for final review. Patient is agreeable to disposition. Will close encounter.

## 2017-10-16 NOTE — Telephone Encounter (Signed)
-----   Message from [place], Generic sent at 10/16/2017 7:34 AM EDT -----    Hi Dr. Sabra Heck, I have left two messages on Dr. Dorothey Baseman voice mail. No one has returned my call. This is regarding my colonoscopy. Can you assist me? Thank you Heather Maldonado

## 2017-10-16 NOTE — Telephone Encounter (Signed)
Gastroenterology Pre-Procedure Review  Request Date:   Requesting Physician: Dr.    PATIENT REVIEW QUESTIONS: The patient responded to the following health history questions as indicated:    1. Are you having any GI issues? no 2. Do you have a personal history of Polyps? yes (yes) 3. Do you have a family history of Colon Cancer or Polyps? no 4. Diabetes Mellitus? no 5. Joint replacements in the past 12 months?no 6. Major health problems in the past 3 months?no 7. Any artificial heart valves, MVP, or defibrillator?no    MEDICATIONS & ALLERGIES:    Patient reports the following regarding taking any anticoagulation/antiplatelet therapy:   Plavix, Coumadin, Eliquis, Xarelto, Lovenox, Pradaxa, Brilinta, or Effient? no Aspirin? no  Patient confirms/reports the following medications:  Current Outpatient Medications  Medication Sig Dispense Refill   acetaminophen-codeine (TYLENOL #3) 300-30 MG tablet Take 1 tablet by mouth every 8 (eight) hours as needed for moderate pain. 30 tablet 0   DULoxetine (CYMBALTA) 30 MG capsule Take 1 capsule (30 mg total) by mouth every morning. 30 capsule 5   DULoxetine (CYMBALTA) 60 MG capsule Take 1 capsule (60 mg total) by mouth at bedtime. 30 capsule 5   EPINEPHrine (EPIPEN 2-PAK) 0.3 mg/0.3 mL IJ SOAJ injection Inject in case of anaphylaxis. 2 Device 3   Multiple Vitamins-Minerals (MULTIVITAMIN PO) Take by mouth daily. 2 gummies     No current facility-administered medications for this visit.     Patient confirms/reports the following allergies:  Allergies  Allergen Reactions   Bee Venom Anaphylaxis    No orders of the defined types were placed in this encounter.   AUTHORIZATION INFORMATION Primary Insurance: 1D#: Group #:  Secondary Insurance: 1D#: Group #:  SCHEDULE INFORMATION: Date:  Time: Location:

## 2017-10-20 NOTE — Telephone Encounter (Signed)
Triage completed. Pt scheduled for colonoscopy on 11/25/17

## 2017-10-20 NOTE — Telephone Encounter (Signed)
Gastroenterology Pre-Procedure Review  Request Date: 11/25/2017 Requesting Physician: Dr. Allen Norris  PATIENT REVIEW QUESTIONS: The patient responded to the following health history questions as indicated:    1. Are you having any GI issues? No  2. Do you have a personal history of Polyps? Yes  3. Do you have a family history of Colon Cancer or Polyps? No  4. Diabetes Mellitus? No  5. Joint replacements in the past 12 months? No  6. Major health problems in the past 3 months? No  7. Any artificial heart valves, MVP, or defibrillator? No     MEDICATIONS & ALLERGIES:    Patient reports the following regarding taking any anticoagulation/antiplatelet therapy:   Plavix, Coumadin, Eliquis, Xarelto, Lovenox, Pradaxa, Brilinta, or Effient? No  Aspirin? No   Patient confirms/reports the following medications:  Current Outpatient Medications  Medication Sig Dispense Refill  . acetaminophen-codeine (TYLENOL #3) 300-30 MG tablet Take 1 tablet by mouth every 8 (eight) hours as needed for moderate pain. 30 tablet 0  . DULoxetine (CYMBALTA) 30 MG capsule Take 1 capsule (30 mg total) by mouth every morning. 30 capsule 5  . DULoxetine (CYMBALTA) 60 MG capsule Take 1 capsule (60 mg total) by mouth at bedtime. 30 capsule 5  . Multiple Vitamins-Minerals (MULTIVITAMIN PO) Take by mouth daily. 2 gummies    . EPINEPHrine (EPIPEN 2-PAK) 0.3 mg/0.3 mL IJ SOAJ injection Inject in case of anaphylaxis. 2 Device 3   No current facility-administered medications for this visit.     Patient confirms/reports the following allergies:  Allergies  Allergen Reactions  . Bee Venom Anaphylaxis    No orders of the defined types were placed in this encounter.   AUTHORIZATION INFORMATION Primary Insurance: 1D#: Group #:  Secondary Insurance: 1D#: Group #:  SCHEDULE INFORMATION: Date: 11/25/2017 Time: Location: [place]

## 2017-10-22 ENCOUNTER — Other Ambulatory Visit: Payer: Self-pay

## 2017-10-22 DIAGNOSIS — Z1211 Encounter for screening for malignant neoplasm of colon: Secondary | ICD-10-CM

## 2017-10-23 MED FILL — DULoxetine HCL 30 MG CPEP: 30 | 30 days supply | Qty: 30 | Fill #5

## 2017-10-23 MED FILL — SHIPPING COST: 1 days supply | Qty: 1 | Fill #4

## 2017-10-23 MED FILL — DULoxetine HCL 60 MG CPEP: 60 | 30 days supply | Qty: 30 | Fill #5

## 2017-10-29 ENCOUNTER — Telehealth: Payer: Self-pay | Admitting: *Deleted

## 2017-10-29 ENCOUNTER — Encounter: Payer: Self-pay | Admitting: Family Medicine

## 2017-10-29 NOTE — Telephone Encounter (Signed)
Pt walked in to the office requesting MMR booster due to recent Measles outbreak. Received verbal authorization for MMR. Office is currently out of stock but will have vaccine in by next week. Pt states she is travelling out of town tomorrow. Pt did not schedule appt for future vaccine at this time.

## 2017-10-29 NOTE — Telephone Encounter (Signed)
See 10/29/17 phone note.

## 2017-11-20 ENCOUNTER — Other Ambulatory Visit: Payer: Self-pay

## 2017-11-20 MED ORDER — NA SULFATE-K SULFATE-MG SULF 17.5-3.13-1.6 GM/177ML PO SOLN: 1.0000 | Freq: Once | ORAL | 0 refills | Status: AC

## 2017-11-24 ENCOUNTER — Encounter: Payer: Self-pay | Admitting: *Deleted

## 2017-11-25 ENCOUNTER — Ambulatory Visit: Payer: 59 | Admitting: Certified Registered"

## 2017-11-25 ENCOUNTER — Encounter
Admission: RE | Disposition: A | Discharge status: Home / Self Care | Payer: Self-pay | Source: Ambulatory Visit | Attending: Gastroenterology

## 2017-11-25 ENCOUNTER — Ambulatory Visit
Admission: RE | Admit: 2017-11-25 | Discharge: 2017-11-25 | Disposition: A | Discharge status: Home / Self Care | Payer: 59 | Source: Ambulatory Visit | Attending: Gastroenterology | Admitting: Gastroenterology

## 2017-11-25 ENCOUNTER — Other Ambulatory Visit: Payer: Self-pay

## 2017-11-25 ENCOUNTER — Encounter: Payer: Self-pay | Admitting: Certified Registered"

## 2017-11-25 DIAGNOSIS — Z860100 Personal history of colon polyps, unspecified: Secondary | ICD-10-CM

## 2017-11-25 DIAGNOSIS — K640 First degree hemorrhoids: Secondary | ICD-10-CM | POA: Diagnosis not present

## 2017-11-25 DIAGNOSIS — Z91030 Bee allergy status: Secondary | ICD-10-CM | POA: Insufficient documentation

## 2017-11-25 DIAGNOSIS — Z79899 Other long term (current) drug therapy: Secondary | ICD-10-CM | POA: Insufficient documentation

## 2017-11-25 DIAGNOSIS — Z1211 Encounter for screening for malignant neoplasm of colon: Secondary | ICD-10-CM | POA: Diagnosis not present

## 2017-11-25 DIAGNOSIS — K621 Rectal polyp: Secondary | ICD-10-CM | POA: Diagnosis not present

## 2017-11-25 DIAGNOSIS — M797 Fibromyalgia: Secondary | ICD-10-CM | POA: Diagnosis not present

## 2017-11-25 DIAGNOSIS — D128 Benign neoplasm of rectum: Secondary | ICD-10-CM | POA: Diagnosis not present

## 2017-11-25 HISTORY — PX: COLONOSCOPY WITH PROPOFOL: SHX5780

## 2017-11-25 SURGERY — COLONOSCOPY WITH PROPOFOL
Anesthesia: General

## 2017-11-25 MED ORDER — SODIUM CHLORIDE 0.9 % IV SOLN
INTRAVENOUS | Status: DC
  Administered 2017-11-25: 08:00:00 via INTRAVENOUS

## 2017-11-25 MED ORDER — GLYCOPYRROLATE 0.2 MG/ML IJ SOLN
INTRAMUSCULAR | Status: AC
  Filled 2017-11-25: qty 1

## 2017-11-25 MED ORDER — PROPOFOL 10 MG/ML IV BOLUS
INTRAVENOUS | Status: DC | PRN
  Administered 2017-11-25 (×2): 50 mg via INTRAVENOUS
  Administered 2017-11-25: 100 mg via INTRAVENOUS
  Administered 2017-11-25 (×3): 50 mg via INTRAVENOUS

## 2017-11-25 MED ORDER — PHENYLEPHRINE HCL 10 MG/ML IJ SOLN
INTRAMUSCULAR | Status: DC | PRN
  Administered 2017-11-25: 100 ug via INTRAVENOUS

## 2017-11-25 MED ORDER — GLYCOPYRROLATE 0.2 MG/ML IJ SOLN
INTRAMUSCULAR | Status: DC | PRN
  Administered 2017-11-25: 0.1 mg via INTRAVENOUS

## 2017-11-25 MED ORDER — PROPOFOL 500 MG/50ML IV EMUL
INTRAVENOUS | Status: AC
  Filled 2017-11-25: qty 50

## 2017-11-25 MED ORDER — LIDOCAINE HCL (PF) 2 % IJ SOLN
INTRAMUSCULAR | Status: AC
  Filled 2017-11-25: qty 10

## 2017-11-25 MED ORDER — LIDOCAINE HCL (PF) 1 % IJ SOLN
INTRAMUSCULAR | Status: AC
  Filled 2017-11-25: qty 2

## 2017-11-25 MED ORDER — LIDOCAINE HCL (CARDIAC) PF 100 MG/5ML IV SOSY
PREFILLED_SYRINGE | INTRAVENOUS | Status: DC | PRN
  Administered 2017-11-25: 40 mg via INTRAVENOUS

## 2017-11-25 NOTE — Anesthesia Post-op Follow-up Note (Signed)
Anesthesia QCDR form completed.        

## 2017-11-25 NOTE — Op Note (Signed)
[facility] Gastroenterology Patient Name: Heather Maldonado Procedure Date: 11/25/2017 8:34 AM MRN: [id number] Account #: [id number] Date of Birth: September 19, 1960 Admit Type: Outpatient Age: 57 Room: [facility] An ENDO ROOM 4 Gender: Female Note Status: Finalized Procedure:            Colonoscopy Indications:          High risk colon cancer surveillance: Personal history                        of colonic polyps Providers:            Lucilla Lame MD, MD Referring MD:         Shelda Pal (Referring MD) Medicines:            Propofol per Anesthesia Complications:        No immediate complications. Procedure:            Pre-Anesthesia Assessment:                       - Prior to the procedure, a History and Physical was                        performed, and patient medications and allergies were                        reviewed. The patient's tolerance of previous                        anesthesia was also reviewed. The risks and benefits of                        the procedure and the sedation options and risks were                        discussed with the patient. All questions were                        answered, and informed consent was obtained. Prior                        Anticoagulants: The patient has taken no previous                        anticoagulant or antiplatelet agents. ASA Grade                        Assessment: II - A patient with mild systemic disease.                        After reviewing the risks and benefits, the patient was                        deemed in satisfactory condition to undergo the                        procedure.                       After obtaining informed consent, the colonoscope was  passed under direct vision. Throughout the procedure,                        the patient's blood pressure, pulse, and oxygen                        saturations were monitored continuously. The   Colonoscope was introduced through the anus and                        advanced to the the cecum, identified by appendiceal                        orifice and ileocecal valve. The colonoscopy was                        performed without difficulty. The patient tolerated the                        procedure well. The quality of the bowel preparation                        was excellent. Findings:      The perianal and digital rectal examinations were normal.      A 2 mm polyp was found in the rectum. The polyp was sessile. The polyp       was removed with a cold biopsy forceps. Resection and retrieval were       complete.      Non-bleeding internal hemorrhoids were found during retroflexion. The       hemorrhoids were Grade I (internal hemorrhoids that do not prolapse). Impression:           - One 2 mm polyp in the rectum, removed with a cold                        biopsy forceps. Resected and retrieved.                       - Non-bleeding internal hemorrhoids. Recommendation:       - Discharge patient to home.                       - Resume previous diet.                       - Continue present medications.                       - Await pathology results.                       - Repeat colonoscopy in 5 years for surveillance. Procedure Code(s):    --- Professional ---                       [phone], Colonoscopy, flexible; with biopsy, single or                        multiple Diagnosis Code(s):    --- Professional ---                       Z86.010, Personal history of  colonic polyps                       K62.1, Rectal polyp CPT copyright 2017 American Medical Association. All rights reserved. The codes documented in this report are preliminary and upon coder review may  be revised to meet current compliance requirements. Lucilla Lame MD, MD 11/25/2017 9:02:39 AM This report has been signed electronically. Number of Addenda: 0 Note Initiated On: 11/25/2017 8:34 AM Scope Withdrawal Time: 0  hours 9 minutes 53 seconds  Total Procedure Duration: 0 hours 14 minutes 46 seconds       [facility]

## 2017-11-25 NOTE — Transfer of Care (Signed)
Immediate Anesthesia Transfer of Care Note  Patient: Heather Maldonado  Procedure(s) Performed: COLONOSCOPY WITH PROPOFOL (N/A )  Patient Location: PACU and Endoscopy Unit  Anesthesia Type:General  Level of Consciousness: awake, alert , oriented and patient cooperative  Airway & Oxygen Therapy: Patient Spontanous Breathing  Post-op Assessment: Report given to RN, Post -op Vital signs reviewed and stable and Patient moving all extremities  Post vital signs: Reviewed and stable  Last Vitals:  Vitals Value Taken Time  BP 85/52 11/25/2017  9:01 AM  Temp 36.1 C 11/25/2017  9:01 AM  Pulse 74 11/25/2017  9:01 AM  Resp 18 11/25/2017  9:01 AM  SpO2 100 % 11/25/2017  9:01 AM    Last Pain:  Vitals:   11/25/17 0901  TempSrc: Tympanic  PainSc: Asleep         Complications: No apparent anesthesia complications

## 2017-11-25 NOTE — Anesthesia Preprocedure Evaluation (Signed)
Anesthesia Evaluation  Patient identified by MRN, date of birth, ID band Patient awake    Reviewed: Allergy & Precautions, H&P , NPO status , Patient's Chart, lab work & pertinent test results, reviewed documented beta blocker date and time   History of Anesthesia Complications Negative for: history of anesthetic complications  Airway Mallampati: I  TM Distance: >3 FB Neck ROM: full    Dental  (+) Caps, Dental Advidsory Given, Teeth Intact   Pulmonary neg pulmonary ROS,           Cardiovascular Exercise Tolerance: Good negative cardio ROS       Neuro/Psych neg Seizures  Neuromuscular disease (fibromyalgia) negative psych ROS   GI/Hepatic negative GI ROS, Neg liver ROS,   Endo/Other  negative endocrine ROS  Renal/GU negative Renal ROS  negative genitourinary   Musculoskeletal   Abdominal   Peds  Hematology negative hematology ROS (+)   Anesthesia Other Findings Past Medical History: No date: Colon polyp 02/03/2017: Common migraine with intractable migraine No date: Fibromyalgia No date: Headache No date: History of chicken pox   Reproductive/Obstetrics negative OB ROS                             Anesthesia Physical Anesthesia Plan  ASA: II  Anesthesia Plan: General   Post-op Pain Management:    Induction: Intravenous  PONV Risk Score and Plan: 3 and Propofol infusion  Airway Management Planned: Nasal Cannula  Additional Equipment:   Intra-op Plan:   Post-operative Plan:   Informed Consent: I have reviewed the patients History and Physical, chart, labs and discussed the procedure including the risks, benefits and alternatives for the proposed anesthesia with the patient or authorized representative who has indicated his/her understanding and acceptance.   Dental Advisory Given  Plan Discussed with: Anesthesiologist, CRNA and Surgeon  Anesthesia Plan Comments:          Anesthesia Quick Evaluation

## 2017-11-25 NOTE — Anesthesia Postprocedure Evaluation (Signed)
Anesthesia Post Note  Patient: Heather Maldonado  Procedure(s) Performed: COLONOSCOPY WITH PROPOFOL (N/A )  Patient location during evaluation: Endoscopy Anesthesia Type: General Level of consciousness: awake and alert, oriented and patient cooperative Pain management: satisfactory to patient Vital Signs Assessment: post-procedure vital signs reviewed and stable Respiratory status: spontaneous breathing and respiratory function stable Cardiovascular status: blood pressure returned to baseline and stable Postop Assessment: no headache, no backache, patient able to bend at knees, no apparent nausea or vomiting, adequate Maldonado intake and able to ambulate Anesthetic complications: no     Last Vitals:  Vitals:   11/25/17 0756 11/25/17 0901  BP: (!) 151/99 (!) 85/52  Pulse: 64 74  Resp: 16 18  Temp: (!) 36.3 C (!) 36.1 C  SpO2: 100% 100%    Last Pain:  Vitals:   11/25/17 0901  TempSrc: Tympanic  PainSc: Asleep                 Dilpreet Faires H Keyani Rigdon

## 2017-11-25 NOTE — H&P (Signed)
Lucilla Lame, MD [address] [place], [place] [address] [phone] Fax : [phone]  Primary Care Physician:  Shelda Pal, DO Primary Gastroenterologist:  Dr. Allen Norris  Pre-Procedure History & Physical: HPI:  SHARDA KEDDY is a 57 y.o. female is here for an colonoscopy.   Past Medical History:  Diagnosis Date  . Colon polyp   . Common migraine with intractable migraine 02/03/2017  . Fibromyalgia   . Headache   . History of chicken pox     Past Surgical History:  Procedure Laterality Date  . CESAREAN SECTION  1985  . ELBOW SURGERY Right 2004   tendon   . ENDOMETRIAL ABLATION  2011  . SESMOIDECTOMY Right 2005   right big toe  . TUBAL LIGATION  1997  . ureter repair  1990   kink in ureter    Prior to Admission medications   Medication Sig Start Date End Date Taking? Authorizing Provider  acetaminophen-codeine (TYLENOL #3) 300-30 MG tablet Take 1 tablet by mouth every 8 (eight) hours as needed for moderate pain. 05/21/17  Yes Shelda Pal, DO  DULoxetine (CYMBALTA) 30 MG capsule Take 1 capsule (30 mg total) by mouth every morning. 05/27/17  Yes Ward Givens, NP  DULoxetine (CYMBALTA) 60 MG capsule Take 1 capsule (60 mg total) by mouth at bedtime. 05/27/17  Yes Ward Givens, NP  EPINEPHrine (EPIPEN 2-PAK) 0.3 mg/0.3 mL IJ SOAJ injection Inject in case of anaphylaxis. 11/29/16  Yes Shelda Pal, DO  Multiple Vitamins-Minerals (MULTIVITAMIN PO) Take by mouth daily. 2 gummies   Yes [provider]    Allergies as of 10/23/2017 - Review Complete 10/03/2017  Allergen Reaction Noted  . 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 Cancer Maternal Aunt        Breast    Social History   Socioeconomic History  . Marital status: Married    Spouse name: Matt  . Number of children: 2  . Years of education: 71  . Highest education level: Not on file  Occupational  History  . Occupation: [employer]  Social Needs  . Financial resource strain: Not on file  . Food insecurity:    Worry: Not on file    Inability: Not on file  . Transportation needs:    Medical: Not on file    Non-medical: Not on file  Tobacco Use  . Smoking status: Never Smoker  . Smokeless tobacco: Never Used  Substance and Sexual Activity  . Alcohol use: Yes    Alcohol/week: 2.4 oz    Types: 4 Shots of liquor per week  . Drug use: No  . Sexual activity: Yes    Birth control/protection: Surgical  Lifestyle  . Physical activity:    Days per week: Not on file    Minutes per session: Not on file  . Stress: Not on file  Relationships  . Social connections:    Talks on phone: Not on file    Gets together: Not on file    Attends religious service: Not on file    Active member of club or organization: Not on file    Attends meetings of clubs or organizations: Not on file    Relationship status: Not on file  . Intimate partner violence:    Fear of current or ex partner: Not on file    Emotionally abused: Not on file    Physically abused: Not on  file    Forced sexual activity: Not on file  Other Topics Concern  . Not on file  Social History Narrative   Lives with husband   Caffeine use: coffee daily- 3 cups   Right handed    Review of Systems: See HPI, otherwise negative ROS  Physical Exam: BP (!) 151/99   Pulse 64   Temp (!) 97.4 F (36.3 C)   Resp 16   Ht 5\' 6"  (1.676 m)   Wt 155 lb (70.3 kg)   SpO2 100%   BMI 25.02 kg/m  General:   Alert,  pleasant and cooperative in NAD Head:  Normocephalic and atraumatic. Neck:  Supple; no masses or thyromegaly. Lungs:  Clear throughout to auscultation.    Heart:  Regular rate and rhythm. Abdomen:  Soft, nontender and nondistended. Normal bowel sounds, without guarding, and without rebound.   Neurologic:  Alert and  oriented x4;  grossly normal neurologically.  Impression/Plan: Cecille Po is here for an  colonoscopy to be performed for history of colon polyps  Risks, benefits, limitations, and alternatives regarding  colonoscopy have been reviewed with the patient.  Questions have been answered.  All parties agreeable.   Lucilla Lame, MD  11/25/2017, 8:28 AM

## 2017-11-26 LAB — SURGICAL PATHOLOGY

## 2017-11-27 ENCOUNTER — Encounter: Payer: Self-pay | Admitting: Gastroenterology

## 2017-11-28 MED FILL — SHIPPING COST: 1 days supply | Qty: 1 | Fill #5

## 2017-11-28 MED FILL — DULoxetine HCL 60 MG CPEP: 60 | 30 days supply | Qty: 30 | Fill #0

## 2017-11-28 MED FILL — DULoxetine HCL 30 MG CPEP: 30 | 30 days supply | Qty: 30 | Fill #0

## 2017-12-03 ENCOUNTER — Encounter: Payer: Self-pay | Admitting: Family Medicine

## 2017-12-03 ENCOUNTER — Other Ambulatory Visit: Payer: Self-pay | Admitting: Family Medicine

## 2017-12-03 MED ORDER — ACETAMINOPHEN-CODEINE #3 300-30 MG PO TABS: 1.0000 | ORAL_TABLET | Freq: Three times a day (TID) | ORAL | 0 refills | Status: DC | PRN

## 2017-12-04 ENCOUNTER — Telehealth: Payer: Self-pay

## 2017-12-04 MED ORDER — ACETAMINOPHEN-CODEINE #3 300-30 MG PO TABS: 1.0000 | ORAL_TABLET | Freq: Three times a day (TID) | ORAL | 0 refills | Status: DC | PRN

## 2017-12-04 NOTE — Addendum Note (Signed)
Addended by: Ames Coupe on: 12/04/2017 04:39 PM   Modules accepted: Orders

## 2017-12-04 NOTE — Telephone Encounter (Signed)
Patient requesting rx sent yesterday for tylenol with codeine to be sent to [facility] in [place]. Rx at [facility] cancelled. Please sent rx to [facility].

## 2017-12-28 MED FILL — DULoxetine HCL 30 MG CPEP: 30 | 30 days supply | Qty: 30 | Fill #1

## 2017-12-29 MED FILL — SHIPPING COST: 1 days supply | Qty: 1 | Fill #6

## 2017-12-29 MED FILL — DULoxetine HCL 60 MG CPEP: 60 | 30 days supply | Qty: 30 | Fill #1

## 2017-12-31 DIAGNOSIS — K1329 Other disturbances of oral epithelium, including tongue: Secondary | ICD-10-CM | POA: Diagnosis not present

## 2018-01-01 ENCOUNTER — Encounter: Payer: Self-pay | Admitting: Family Medicine

## 2018-01-07 ENCOUNTER — Encounter: Payer: Self-pay | Admitting: Family Medicine

## 2018-01-12 ENCOUNTER — Ambulatory Visit: Payer: 59 | Admitting: Medical

## 2018-01-12 ENCOUNTER — Encounter: Payer: Self-pay | Admitting: Medical

## 2018-01-12 VITALS — BP 130/77 | HR 92 | Temp 98.8°F | Resp 16 | Ht 66.0 in | Wt 149.8 lb

## 2018-01-12 DIAGNOSIS — J040 Acute laryngitis: Secondary | ICD-10-CM

## 2018-01-12 DIAGNOSIS — H9201 Otalgia, right ear: Secondary | ICD-10-CM | POA: Diagnosis not present

## 2018-01-12 DIAGNOSIS — Z9189 Other specified personal risk factors, not elsewhere classified: Secondary | ICD-10-CM

## 2018-01-12 DIAGNOSIS — J0100 Acute maxillary sinusitis, unspecified: Secondary | ICD-10-CM | POA: Diagnosis not present

## 2018-01-12 DIAGNOSIS — R059 Cough, unspecified: Secondary | ICD-10-CM

## 2018-01-12 DIAGNOSIS — Z889 Allergy status to unspecified drugs, medicaments and biological substances status: Secondary | ICD-10-CM | POA: Diagnosis not present

## 2018-01-12 DIAGNOSIS — R05 Cough: Secondary | ICD-10-CM

## 2018-01-12 DIAGNOSIS — Z01419 Encounter for gynecological examination (general) (routine) without abnormal findings: Secondary | ICD-10-CM | POA: Diagnosis not present

## 2018-01-12 DIAGNOSIS — Z8489 Family history of other specified conditions: Secondary | ICD-10-CM

## 2018-01-12 MED ORDER — AMOXICILLIN-POT CLAVULANATE 875-125 MG PO TABS: 1.0000 | ORAL_TABLET | Freq: Two times a day (BID) | ORAL | 0 refills | Status: DC

## 2018-01-12 MED ORDER — BENZONATATE 100 MG PO CAPS: 100.0000 mg | ORAL_CAPSULE | Freq: Three times a day (TID) | ORAL | 0 refills | Status: DC | PRN

## 2018-01-12 NOTE — Patient Instructions (Addendum)
  You do appear to have some allergy like symptoms just recently with concern for rt ear infection and sinus infection.  You declined nasal steroid spray but you can use zyrtec D-12 hour formulation over the counter. For cough prescribed benzonotate. For infections I prescribed augmentin.  For history of allergies, I did discuss with Dr. Nani Ravens and placed IGG labs.  Follow up 7-10 days or as needed

## 2018-01-12 NOTE — Progress Notes (Signed)
Subjective:    Patient ID: Heather Maldonado, female    DOB: February 24, 1961, 57 y.o.   MRN: [id number]  HPI  Last Saturday she took grandson to park. She got hoarse voice, and nasal congestion later that day.. Also some rt ear pain, sneezing, sinus pressure and productive cough over past 9 days now. Not getting better.   Pt used to have allergies mostly in the spring. Pt states pcp had discussed gettting some IGG studies in the past. She state now interested in getting those.   No fever or chills.  Some sweating.   Pt declines nasal sprays.  Review of Systems  Constitutional: Positive for diaphoresis. Negative for chills, fatigue and fever.       But not on exam. Not obvious.  HENT: Positive for congestion, postnasal drip, sinus pressure, sinus pain and voice change. Negative for ear pain and sore throat.   Respiratory: Negative for cough, chest tightness and wheezing.   Cardiovascular: Negative for chest pain and palpitations.  Gastrointestinal: Negative for abdominal pain and anal bleeding.  Musculoskeletal: Negative for back pain and myalgias.  Skin: Negative for rash.  Neurological: Negative for dizziness, speech difficulty and weakness.  Hematological: Negative for adenopathy. Does not bruise/bleed easily.  Psychiatric/Behavioral: Negative for behavioral problems.   Past Medical History:  Diagnosis Date  . Colon polyp   . Common migraine with intractable migraine 02/03/2017  . Fibromyalgia   . Headache   . History of chicken pox      Social History   Socioeconomic History  . Marital status: Married    Spouse name: Matt  . Number of children: 2  . Years of education: 42  . Highest education level: Not on file  Occupational History  . Occupation: [employer]  Social Needs  . Financial resource strain: Not on file  . Food insecurity:    Worry: Not on file    Inability: Not on file  . Transportation needs:    Medical: Not on file    Non-medical: Not on file    Tobacco Use  . Smoking status: Never Smoker  . Smokeless tobacco: Never Used  Substance and Sexual Activity  . Alcohol use: Yes    Alcohol/week: 2.4 oz    Types: 4 Shots of liquor per week  . Drug use: No  . Sexual activity: Yes    Birth control/protection: Surgical  Lifestyle  . Physical activity:    Days per week: Not on file    Minutes per session: Not on file  . Stress: Not on file  Relationships  . Social connections:    Talks on phone: Not on file    Gets together: Not on file    Attends religious service: Not on file    Active member of club or organization: Not on file    Attends meetings of clubs or organizations: Not on file    Relationship status: Not on file  . Intimate partner violence:    Fear of current or ex partner: Not on file    Emotionally abused: Not on file    Physically abused: Not on file    Forced sexual activity: Not on file  Other Topics Concern  . Not on file  Social History Narrative   Lives with husband   Caffeine use: coffee daily- 3 cups   Right handed    Past Surgical History:  Procedure Laterality Date  . CESAREAN SECTION  1985  . COLONOSCOPY WITH PROPOFOL N/A 11/25/2017  Procedure: COLONOSCOPY WITH PROPOFOL;  Surgeon: Lucilla Lame, MD;  Location: [facility] ENDOSCOPY;  Service: Endoscopy;  Laterality: N/A;  . ELBOW SURGERY Right 2004   tendon   . ENDOMETRIAL ABLATION  2011  . SESMOIDECTOMY Right 2005   right big toe  . TUBAL LIGATION  1997  . ureter repair  1990   kink in ureter    Family History  Problem Relation Age of Onset  . Cancer Maternal Aunt        Breast    Allergies  Allergen Reactions  . Bee Venom Anaphylaxis    Current Outpatient Medications on File Prior to Visit  Medication Sig Dispense Refill  . acetaminophen-codeine (TYLENOL #3) 300-30 MG tablet Take 1 tablet by mouth every 8 (eight) hours as needed for moderate pain. 30 tablet 0  . DULoxetine (CYMBALTA) 30 MG capsule Take 1 capsule (30 mg total) by mouth  every morning. 30 capsule 5  . DULoxetine (CYMBALTA) 60 MG capsule Take 1 capsule (60 mg total) by mouth at bedtime. 30 capsule 5  . EPINEPHrine (EPIPEN 2-PAK) 0.3 mg/0.3 mL IJ SOAJ injection Inject in case of anaphylaxis. 2 Device 3  . Multiple Vitamins-Minerals (MULTIVITAMIN Maldonado) Take by mouth daily. 2 gummies     No current facility-administered medications on file prior to visit.     BP 130/77   Pulse 92   Temp 98.8 F (37.1 C) (Oral)   Resp 16   Ht 5\' 6"  (1.676 m)   Wt 149 lb 12.8 oz (67.9 kg)   SpO2 100%   BMI 24.18 kg/m       Objective:   Physical Exam  General  Mental Status - Alert. General Appearance - Well groomed. Not in acute distress.  Skin Rashes- No Rashes.  HEENT Head- Normal. Ear Auditory Canal - Left- Normal. Right - Normal.Tympanic Membrane- Left- Normal. Right- central redness Eye Sclera/Conjunctiva- Left- Normal. Right- Normal. Nose & Sinuses Nasal Mucosa- Left-  Boggy and Congested. Right-  Boggy and  Congested.Bilateral maxillary and frontal sinus pressure. Mouth & Throat Lips: Upper Lip- Normal: no dryness, cracking, pallor, cyanosis, or vesicular eruption. Lower Lip-Normal: no dryness, cracking, pallor, cyanosis or vesicular eruption. Buccal Mucosa- Bilateral- No Aphthous ulcers. Oropharynx- No Discharge or Erythema. +pnd Tonsils: Characteristics- Bilateral- No Erythema or Congestion. Size/Enlargement- Bilateral- No enlargement. Discharge- bilateral-None.  Neck Neck- Supple. No Masses.   Chest and Lung Exam Auscultation: Breath Sounds:-Clear even and unlabored.  Cardiovascular Auscultation:Rythm- Regular, rate and rhythm. Murmurs & Other Heart Sounds:Ausculatation of the heart reveal- No Murmurs.  Lymphatic Head & Neck General Head & Neck Lymphatics: Bilateral: Description- No Localized lymphadenopathy.       Assessment & Plan:  You do appear to have some allergy like symptoms just recently with concern for rt ear infection and  sinus infection.(minimal concern for bronchitis presently)  You declined nasal steroid spray but you can use zyrtec D-12 hour formulation over the counter. For cough prescribed benzonotate. For infections I prescribed augmentin.  For history of allergies, I did discuss with Dr. Nani Ravens and placed IGG labs.  Follow up 7-10 days or as needed

## 2018-01-13 LAB — ALLERGY PANEL 11, MOLD GROUP
Allergen, A. alternata, m6: <0.1 kU/L
Allergen, Mucor Racemosus, M4: <0.1 kU/L
CLADOSPORIUM HERBARUM (M2) IGE: <0.1 kU/L
CLASS: 0
CLASS: 0
CLASS: 0
Candida Albicans: <0.1 kU/L
Class: 0
Class: 0

## 2018-01-13 LAB — FOOD ALLERGY PROFILE
Allergen, Salmon, f41: <0.1 kU/L
Almonds: <0.1 kU/L
CLASS: 0
CLASS: 0
CLASS: 0
CLASS: 0
CLASS: 0
CLASS: 0
CLASS: 0
CLASS: 0
CLASS: 0
CLASS: 0
CLASS: 0
CLASS: 0
CLASS: 0
CLASS: 0
CLASS: 0
Milk IgE: <0.1 kU/L
Scallop IgE: <0.1 kU/L
Sesame Seed f10: <0.1 kU/L
Shrimp IgE: <0.1 kU/L
Tuna IgE: <0.1 kU/L
Walnut: <0.1 kU/L

## 2018-01-13 LAB — ALLERGY PANEL, REGION 2, GRASSES
CLASS: 0
CLASS: 0
CLASS: 0
CLASS: 0
Class: 0
G005 Rye, Perennial: <0.1 kU/L
G009 Red Top: <0.1 kU/L
Johnson Grass: <0.1 kU/L
ORCHARD GRASS (COCKSFOOT) (G3) IGE: <0.1 kU/L

## 2018-01-13 LAB — INTERPRETATION:

## 2018-01-26 MED FILL — DULoxetine HCL 30 MG CPEP: 30 | 30 days supply | Qty: 30 | Fill #2

## 2018-01-26 MED FILL — DULoxetine HCL 60 MG CPEP: 60 | 30 days supply | Qty: 30 | Fill #2

## 2018-01-26 MED FILL — SHIPPING COST: 1 days supply | Qty: 1 | Fill #7

## 2018-02-10 DIAGNOSIS — K1329 Other disturbances of oral epithelium, including tongue: Secondary | ICD-10-CM | POA: Diagnosis not present

## 2018-02-16 DIAGNOSIS — K1329 Other disturbances of oral epithelium, including tongue: Secondary | ICD-10-CM | POA: Diagnosis not present

## 2018-02-18 ENCOUNTER — Encounter: Payer: Self-pay | Admitting: Medical

## 2018-02-24 MED FILL — DULoxetine HCL 30 MG CPEP: 30 | 30 days supply | Qty: 30 | Fill #3

## 2018-02-24 MED FILL — DULoxetine HCL 60 MG CPEP: 60 | 30 days supply | Qty: 30 | Fill #3

## 2018-02-24 MED FILL — SHIPPING COST: 1 days supply | Qty: 1 | Fill #8

## 2018-03-16 DIAGNOSIS — K1321 Leukoplakia of oral mucosa, including tongue: Secondary | ICD-10-CM | POA: Diagnosis not present

## 2018-03-16 DIAGNOSIS — D1030 Benign neoplasm of unspecified part of mouth: Secondary | ICD-10-CM | POA: Diagnosis not present

## 2018-03-16 DIAGNOSIS — K146 Glossodynia: Secondary | ICD-10-CM | POA: Diagnosis not present

## 2018-03-23 ENCOUNTER — Other Ambulatory Visit: Payer: Self-pay

## 2018-03-23 ENCOUNTER — Encounter
Admission: RE | Admit: 2018-03-23 | Discharge: 2018-03-23 | Disposition: A | Discharge status: Home / Self Care | Payer: 59 | Source: Ambulatory Visit | Attending: Otolaryngology | Admitting: Otolaryngology

## 2018-03-23 DIAGNOSIS — K146 Glossodynia: Secondary | ICD-10-CM | POA: Diagnosis not present

## 2018-03-23 DIAGNOSIS — K140 Glossitis: Secondary | ICD-10-CM | POA: Diagnosis not present

## 2018-03-23 NOTE — Patient Instructions (Signed)
Your procedure is scheduled on: 03-26-18 Report to Same Day Surgery 2nd floor medical mall [facility] Entrance-take elevator on left to 2nd floor.  Check in with surgery information desk.) To find out your arrival time please call [phone] between 1PM - 3PM on 03-25-18  Remember: Instructions that are not followed completely may result in serious medical risk, up to and including death, or upon the discretion of your surgeon and anesthesiologist your surgery may need to be rescheduled.    _x___ 1. Do not eat food after midnight the night before your procedure. You may drink clear liquids up to 2 hours before you are scheduled to arrive at the hospital for your procedure.  Do not drink clear liquids within 2 hours of your scheduled arrival to the hospital.  Clear liquids include  --Water or Apple juice without pulp  --Clear carbohydrate beverage such as ClearFast or Gatorade  --Black Coffee or Clear Tea (No milk, no creamers, do not add anything to the coffee or Tea   ____Ensure clear carbohydrate drink on the way to the hospital for bariatric patients  ____Ensure clear carbohydrate drink 3 hours before surgery for Dr Dwyane Luo patients if physician instructed.   No gum chewing or hard candies.     __x__ 2. No Alcohol for 24 hours before or after surgery.   __x__3. No Smoking or e-cigarettes for 24 prior to surgery.  Do not use any chewable tobacco products for at least 6 hour prior to surgery   ____  4. Bring all medications with you on the day of surgery if instructed.    __x__ 5. Notify your doctor if there is any change in your medical condition     (cold, fever, infections).    x___6. On the morning of surgery brush your teeth with toothpaste and water.  You may rinse your mouth with mouth wash if you wish.  Do not swallow any toothpaste or mouthwash.   Do not wear jewelry, make-up, hairpins, clips or nail polish.  Do not wear lotions, powders, or perfumes. You may wear  deodorant.  Do not shave 48 hours prior to surgery. Men may shave face and neck.  Do not bring valuables to the hospital.    [facility] is not responsible for any belongings or valuables.               Contacts, dentures or bridgework may not be worn into surgery.  Leave your suitcase in the car. After surgery it may be brought to your room.  For patients admitted to the hospital, discharge time is determined by your treatment team.  _  Patients discharged the day of surgery will not be allowed to drive home.  You will need someone to drive you home and stay with you the night of your procedure.    Please read over the following fact sheets that you were given:   [facility] Preparing for Surgery  _x___ TAKE THE FOLLOWING MEDICATION THE MORNING OF SURGERY WITH A SMALL SIP OF WATER. These include:  1. CYMBALTA  2.  3.  4.  5.  6.  ____Fleets enema or Magnesium Citrate as directed.   ____ Use CHG Soap or sage wipes as directed on instruction sheet   ____ Use inhalers on the day of surgery and bring to hospital day of surgery  ____ Stop Metformin and Janumet 2 days prior to surgery.    ____ Take 1/2 of usual insulin dose the night before surgery and  none on the morning surgery.   ____ Follow recommendations from Cardiologist, Pulmonologist or PCP regarding stopping Aspirin, Coumadin, Plavix ,Eliquis, Effient, or Pradaxa, and Pletal.  X____Stop Anti-inflammatories such as Advil, Aleve, Ibuprofen, Motrin, Naproxen, Naprosyn, Goodies powders or aspirin products NOW-OK to take Tylenol    ____ Stop supplements until after surgery.     ____ Bring C-Pap to the hospital.

## 2018-03-24 ENCOUNTER — Telehealth: Payer: Self-pay | Admitting: *Deleted

## 2018-03-24 NOTE — Telephone Encounter (Signed)
Received call back from [facility] and went over the situation once more; she stated that Yes, they do need All Medical Records [which again has to go through our Medical Records department. She stated if I could send her allergy testing, last lab results and the last 2-3 OV notes that that should be enough to work for getting the surgery completed on schedule. Faxed over the things mentioned, along with pt's Health Maintenance page/SLS 10/01

## 2018-03-24 NOTE — Telephone Encounter (Signed)
Received request for Medical Records from [facility]; forwarded to [facility] for email/scan/SLS 10/01

## 2018-03-24 NOTE — Telephone Encounter (Signed)
Received request for Medical Records from [facility], [place], pt attached note stating that she has surgery on 03/26/18 and All Medical Records are being requested [2017 start]. [facility] and explained the situation to them, that request would have to be processed through out Medical Records office and would not be received by 03/26/18; I was forwarded to the Nurse and had to LM. LMOM with contact name and number for return call RE: paperwork/surgery matter and if there was something that we could send from office that would suffice for patient's surgery to be done on schedule. Awaiting call back from ENT/SLS 10/01

## 2018-03-25 ENCOUNTER — Encounter: Payer: Self-pay | Admitting: Anesthesiology

## 2018-03-26 ENCOUNTER — Encounter
Admission: RE | Disposition: A | Discharge status: Home / Self Care | Payer: Self-pay | Source: Ambulatory Visit | Attending: Otolaryngology

## 2018-03-26 ENCOUNTER — Telehealth: Payer: Self-pay | Admitting: Family Medicine

## 2018-03-26 ENCOUNTER — Ambulatory Visit: Payer: 59 | Admitting: Anesthesiology

## 2018-03-26 ENCOUNTER — Ambulatory Visit
Admission: RE | Admit: 2018-03-26 | Discharge: 2018-03-26 | Disposition: A | Discharge status: Home / Self Care | Payer: 59 | Source: Ambulatory Visit | Attending: Otolaryngology | Admitting: Otolaryngology

## 2018-03-26 DIAGNOSIS — M797 Fibromyalgia: Secondary | ICD-10-CM | POA: Diagnosis not present

## 2018-03-26 DIAGNOSIS — L570 Actinic keratosis: Secondary | ICD-10-CM | POA: Diagnosis not present

## 2018-03-26 DIAGNOSIS — K1321 Leukoplakia of oral mucosa, including tongue: Secondary | ICD-10-CM | POA: Insufficient documentation

## 2018-03-26 DIAGNOSIS — Q899 Congenital malformation, unspecified: Secondary | ICD-10-CM | POA: Diagnosis not present

## 2018-03-26 DIAGNOSIS — R234 Changes in skin texture: Secondary | ICD-10-CM | POA: Diagnosis not present

## 2018-03-26 DIAGNOSIS — Z79899 Other long term (current) drug therapy: Secondary | ICD-10-CM | POA: Insufficient documentation

## 2018-03-26 DIAGNOSIS — Z91030 Bee allergy status: Secondary | ICD-10-CM | POA: Diagnosis not present

## 2018-03-26 HISTORY — PX: EXCISION ORAL TUMOR: SHX6265

## 2018-03-26 SURGERY — EXCISION, NEOPLASM, MOUTH
Anesthesia: General | Laterality: Left

## 2018-03-26 MED ORDER — FENTANYL CITRATE (PF) 100 MCG/2ML IJ SOLN: 25.0000 ug | INTRAMUSCULAR | Status: DC | PRN

## 2018-03-26 MED ORDER — PHENYLEPHRINE HCL 10 MG/ML IJ SOLN
INTRAMUSCULAR | Status: AC
  Filled 2018-03-26: qty 1

## 2018-03-26 MED ORDER — OXYCODONE HCL 5 MG/5ML PO SOLN: 10.0000 mg | Freq: Four times a day (QID) | ORAL | 0 refills | Status: DC | PRN

## 2018-03-26 MED ORDER — ONDANSETRON HCL 4 MG/2ML IJ SOLN
INTRAMUSCULAR | Status: DC | PRN
  Administered 2018-03-26: 4 mg via INTRAVENOUS

## 2018-03-26 MED ORDER — DEXAMETHASONE SODIUM PHOSPHATE 10 MG/ML IJ SOLN
INTRAMUSCULAR | Status: AC
  Filled 2018-03-26: qty 1

## 2018-03-26 MED ORDER — EPHEDRINE SULFATE 50 MG/ML IJ SOLN
INTRAMUSCULAR | Status: AC
  Filled 2018-03-26: qty 1

## 2018-03-26 MED ORDER — SEVOFLURANE IN SOLN
RESPIRATORY_TRACT | Status: AC
  Filled 2018-03-26: qty 250

## 2018-03-26 MED ORDER — LACTATED RINGERS IV SOLN
INTRAVENOUS | Status: DC
  Administered 2018-03-26: 07:00:00 via INTRAVENOUS

## 2018-03-26 MED ORDER — LIDOCAINE-EPINEPHRINE 1 %-1:100000 IJ SOLN
INTRAMUSCULAR | Status: AC
  Filled 2018-03-26: qty 1

## 2018-03-26 MED ORDER — FENTANYL CITRATE (PF) 100 MCG/2ML IJ SOLN
INTRAMUSCULAR | Status: AC
  Filled 2018-03-26: qty 2

## 2018-03-26 MED ORDER — PROPOFOL 10 MG/ML IV BOLUS
INTRAVENOUS | Status: DC | PRN
  Administered 2018-03-26: 50 mg via INTRAVENOUS
  Administered 2018-03-26: 150 mg via INTRAVENOUS

## 2018-03-26 MED ORDER — BUPIVACAINE HCL (PF) 0.5 % IJ SOLN
INTRAMUSCULAR | Status: AC
  Filled 2018-03-26: qty 30

## 2018-03-26 MED ORDER — LIDOCAINE VISCOUS HCL 2 % MT SOLN: 10.0000 mL | Freq: Four times a day (QID) | OROMUCOSAL | 0 refills | Status: DC | PRN

## 2018-03-26 MED ORDER — ONDANSETRON HCL 4 MG/2ML IJ SOLN: 4.0000 mg | Freq: Once | INTRAMUSCULAR | Status: DC | PRN

## 2018-03-26 MED ORDER — GLYCOPYRROLATE 0.2 MG/ML IJ SOLN
INTRAMUSCULAR | Status: DC | PRN
  Administered 2018-03-26: 0.2 mg via INTRAVENOUS

## 2018-03-26 MED ORDER — ONDANSETRON HCL 4 MG PO TABS: 4.0000 mg | ORAL_TABLET | Freq: Three times a day (TID) | ORAL | 0 refills | Status: DC | PRN

## 2018-03-26 MED ORDER — PHENYLEPHRINE HCL 10 MG/ML IJ SOLN
INTRAMUSCULAR | Status: DC | PRN
  Administered 2018-03-26: 200 ug via INTRAVENOUS
  Administered 2018-03-26 (×2): 100 ug via INTRAVENOUS
  Administered 2018-03-26 (×2): 200 ug via INTRAVENOUS

## 2018-03-26 MED ORDER — DEXAMETHASONE SODIUM PHOSPHATE 10 MG/ML IJ SOLN
INTRAMUSCULAR | Status: DC | PRN
  Administered 2018-03-26: 10 mg via INTRAVENOUS

## 2018-03-26 MED ORDER — LIDOCAINE HCL (PF) 2 % IJ SOLN
INTRAMUSCULAR | Status: AC
  Filled 2018-03-26: qty 10

## 2018-03-26 MED ORDER — LIDOCAINE-EPINEPHRINE 1 %-1:100000 IJ SOLN
INTRAMUSCULAR | Status: DC | PRN
  Administered 2018-03-26: 2 mL

## 2018-03-26 MED ORDER — CHLORHEXIDINE GLUCONATE 0.12 % MT SOLN: 10.0000 mL | Freq: Four times a day (QID) | OROMUCOSAL | 0 refills | Status: AC

## 2018-03-26 MED ORDER — ONDANSETRON HCL 4 MG/2ML IJ SOLN
INTRAMUSCULAR | Status: AC
  Filled 2018-03-26: qty 2

## 2018-03-26 MED ORDER — LIDOCAINE HCL (CARDIAC) PF 100 MG/5ML IV SOSY
PREFILLED_SYRINGE | INTRAVENOUS | Status: DC | PRN
  Administered 2018-03-26: 100 mg via INTRAVENOUS

## 2018-03-26 MED ORDER — ROCURONIUM BROMIDE 100 MG/10ML IV SOLN
INTRAVENOUS | Status: DC | PRN
  Administered 2018-03-26: 5 mg via INTRAVENOUS

## 2018-03-26 MED ORDER — MIDAZOLAM HCL 2 MG/2ML IJ SOLN
INTRAMUSCULAR | Status: AC
  Filled 2018-03-26: qty 2

## 2018-03-26 MED ORDER — FAMOTIDINE 20 MG PO TABS
20.0000 mg | ORAL_TABLET | Freq: Once | ORAL | Status: AC
  Administered 2018-03-26: 20 mg via ORAL

## 2018-03-26 MED ORDER — SUCCINYLCHOLINE CHLORIDE 20 MG/ML IJ SOLN
INTRAMUSCULAR | Status: DC | PRN
  Administered 2018-03-26: 100 mg via INTRAVENOUS

## 2018-03-26 MED ORDER — MIDAZOLAM HCL 2 MG/2ML IJ SOLN
INTRAMUSCULAR | Status: DC | PRN
  Administered 2018-03-26: 2 mg via INTRAVENOUS

## 2018-03-26 MED ORDER — EPHEDRINE SULFATE 50 MG/ML IJ SOLN
INTRAMUSCULAR | Status: DC | PRN
  Administered 2018-03-26 (×2): 10 mg via INTRAVENOUS

## 2018-03-26 MED ORDER — SUCCINYLCHOLINE CHLORIDE 20 MG/ML IJ SOLN
INTRAMUSCULAR | Status: AC
  Filled 2018-03-26: qty 1

## 2018-03-26 MED ORDER — PROPOFOL 10 MG/ML IV BOLUS
INTRAVENOUS | Status: AC
  Filled 2018-03-26: qty 20

## 2018-03-26 MED ORDER — FENTANYL CITRATE (PF) 100 MCG/2ML IJ SOLN
INTRAMUSCULAR | Status: DC | PRN
  Administered 2018-03-26: 50 ug via INTRAVENOUS
  Administered 2018-03-26 (×2): 25 ug via INTRAVENOUS

## 2018-03-26 MED ORDER — FAMOTIDINE 20 MG PO TABS
ORAL_TABLET | ORAL | Status: AC
  Administered 2018-03-26: 20 mg via ORAL
  Filled 2018-03-26: qty 1

## 2018-03-26 MED FILL — SHIPPING COST: 1 days supply | Qty: 1 | Fill #9

## 2018-03-26 MED FILL — DULoxetine HCL 60 MG CPEP: 60 | 30 days supply | Qty: 30 | Fill #4

## 2018-03-26 MED FILL — DULoxetine HCL 30 MG CPEP: 30 | 30 days supply | Qty: 30 | Fill #4

## 2018-03-26 SURGICAL SUPPLY — 20 items
BLADE EAR TYMPAN 2.5 60D BEAV (BLADE) ×1 IMPLANT
CANISTER SUCT 1200ML W/VALVE (MISCELLANEOUS) ×2 IMPLANT
CORD BIP STRL DISP 12FT (MISCELLANEOUS) ×1 IMPLANT
ELECT CAUTERY NDL 2.0 MIC (NEEDLE) ×1 IMPLANT
ELECT CAUTERY NEEDLE 2.0 MIC (NEEDLE) ×2 IMPLANT
ELECT REM PT RETURN 9FT ADLT (ELECTROSURGICAL) ×2
ELECTRODE REM PT RTRN 9FT ADLT (ELECTROSURGICAL) ×1 IMPLANT
FORCEPS JEWEL BIP 4-3/4 STR (INSTRUMENTS) ×1 IMPLANT
GLOVE BIO SURGEON STRL SZ7 (GLOVE) ×2 IMPLANT
GOWN STRL REUS W/ TWL LRG LVL3 (GOWN DISPOSABLE) ×2 IMPLANT
GOWN STRL REUS W/TWL LRG LVL3 (GOWN DISPOSABLE) ×2
HANDLE SUCTION POOLE (INSTRUMENTS) IMPLANT
NS IRRIG 500ML POUR BTL (IV SOLUTION) ×2 IMPLANT
PACK HEAD/NECK (MISCELLANEOUS) ×2 IMPLANT
SOL ANTI-FOG 6CC FOG-OUT (MISCELLANEOUS) IMPLANT
SOL FOG-OUT ANTI-FOG 6CC (MISCELLANEOUS) ×1
SUCTION POOLE HANDLE (INSTRUMENTS) ×2
SUT CHROMIC 4 0 RB 1X27 (SUTURE) ×2 IMPLANT
SUT SILK 2 0 SH (SUTURE) ×2 IMPLANT
SYR 3ML LL SCALE MARK (SYRINGE) ×2 IMPLANT

## 2018-03-26 NOTE — Telephone Encounter (Signed)
Copied from [place] [id number]. Topic: General - Other >> Mar 26, 2018  4:26 PM Vernona Rieger wrote: Reason for CRM: Patient states that her ENT doctor is faxing over her results today and wants Dr Nani Ravens to be on the look out and give her a call

## 2018-03-26 NOTE — Anesthesia Postprocedure Evaluation (Signed)
Anesthesia Post Note  Patient: Heather Maldonado  Procedure(s) Performed: EXCISION RETROMOLAR TRIGONE (Left )  Patient location during evaluation: PACU Anesthesia Type: General Level of consciousness: awake and alert Pain management: pain level controlled Vital Signs Assessment: post-procedure vital signs reviewed and stable Respiratory status: spontaneous breathing, nonlabored ventilation, respiratory function stable and patient connected to nasal cannula oxygen Cardiovascular status: blood pressure returned to baseline and stable Postop Assessment: no apparent nausea or vomiting Anesthetic complications: no     Last Vitals:  Vitals:   03/26/18 0930 03/26/18 0943  BP: 136/82 (!) 145/78  Pulse: 77 67  Resp: 15 16  Temp:  36.9 C  SpO2: 96% 98%    Last Pain:  Vitals:   03/26/18 0943  TempSrc: Temporal  PainSc: 0-No pain                 Benett Swoyer S

## 2018-03-26 NOTE — Transfer of Care (Signed)
Immediate Anesthesia Transfer of Care Note  Patient: Cecille Po  Procedure(s) Performed: EXCISION RETROMOLAR TRIGONE (Left )  Patient Location: PACU  Anesthesia Type:General  Level of Consciousness: awake, alert  and oriented  Airway & Oxygen Therapy: Patient Spontanous Breathing and Patient connected to face mask oxygen  Post-op Assessment: Report given to RN and Post -op Vital signs reviewed and stable  Post vital signs: Reviewed and stable  Last Vitals:  Vitals Value Taken Time  BP 144/93 03/26/2018  9:00 AM  Temp 36.3 C 03/26/2018  9:00 AM  Pulse 79 03/26/2018  9:02 AM  Resp 17 03/26/2018  9:03 AM  SpO2 98 % 03/26/2018  9:00 AM  Vitals shown include unvalidated device data.  Last Pain:  Vitals:   03/26/18 0900  TempSrc:   PainSc: 0-No pain         Complications: No apparent anesthesia complications

## 2018-03-26 NOTE — Anesthesia Procedure Notes (Signed)
Procedure Name: Intubation Date/Time: 03/26/2018 7:46 AM Performed by: Johnna Acosta, CRNA Pre-anesthesia Checklist: Patient identified, Emergency Drugs available, Suction available, Patient being monitored and Timeout performed Patient Re-evaluated:Patient Re-evaluated prior to induction Oxygen Delivery Method: Circle system utilized Preoxygenation: Pre-oxygenation with 100% oxygen Induction Type: IV induction Ventilation: Mask ventilation without difficulty Laryngoscope Size: Miller and 2 Grade View: Grade I Tube type: Oral Tube size: 7.0 mm Number of attempts: 1 Airway Equipment and Method: Stylet Placement Confirmation: ETT inserted through vocal cords under direct vision,  positive ETCO2 and breath sounds checked- equal and bilateral Secured at: 21 cm Tube secured with: Tape Dental Injury: Teeth and Oropharynx as per pre-operative assessment

## 2018-03-26 NOTE — Anesthesia Post-op Follow-up Note (Signed)
Anesthesia QCDR form completed.        

## 2018-03-26 NOTE — H&P (Signed)
..  History and Physical paper copy reviewed and updated date of procedure and will be scanned into system.  Patient seen and examined.  

## 2018-03-26 NOTE — Discharge Instructions (Signed)

## 2018-03-26 NOTE — Op Note (Signed)
..  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  Additional anterior deep margin taken.  Procedure: After the patient was identified in holding and the history and physical and consent was reviewed, the patient was taken to the operating room and placed in a supine position.  General endotracheal anesthesia was induced in the normal fashion.  At this time, the patient was rotated 45 degrees and a shoulder roll was placed.  At this time, a McIvor mouthgag was inserted into the patient's oral cavity and suspended from the Auxier stand without injury to teeth, lips, or gums.  This demonstrated adequate visualization of the patient's left retromolar trigone.  This showed an area of leukoplakia with central ulceration originating behind the patient's 2nd molar and extending into the gingival buccal sulcus and slightly onto the buccal mucosa.  33ml of 1% lidocaine with 1:200,000 epinephrine was injected into the patient's left retromolar trigone surrounding the lesion.  Using a combination of a 15 blade and angled Beaver blade, the mucosa was incised with a 2 to 70mm margin surrounding the leukoplakia.  Dissection was taken deep beneath the mucosa to the submucosal plane.  Using the beaver blade, submucosal dissection was carried anteriorly until anterior to the leukoplakia and central ulceration.  Care was taken to avoid disruption of the gingiva surrounding the 2nd molar.  The specimen was then  passed off the table for permanent pathological evaluation and oriented on Telfa.  An additional anterior and deep margin was taken.  At this time the patient was released from suspension for approximately 1 minute to allow for the tongue to rest.  The patient was replaced in suspension and using the [facility] blade back cuts were made in the mucosa overlying the mandibular alveolus.  This was submucosally mobilized and then 4.0 chromic suture in an interupted fashion was use to close the mucosal in a loose fashion.  At this time, the patient's oral cavity was irrigated with sterile saline.  Following this  The care of patient was returned to anesthesia, awakened, and transferred to recovery in stable condition.  Dispo:  PACU to home  Plan: Soft diet.  Fluid hydration  Recheck my office two weeks.   Charles Andringa 8:40 AM 03/26/2018

## 2018-03-26 NOTE — Anesthesia Preprocedure Evaluation (Signed)
Anesthesia Evaluation  Patient identified by MRN, date of birth, ID band Patient awake    Reviewed: Allergy & Precautions, NPO status , Patient's Chart, lab work & pertinent test results, reviewed documented beta blocker date and time   Airway Mallampati: II  TM Distance: >3 FB     Dental  (+) Chipped   Pulmonary           Cardiovascular      Neuro/Psych  Headaches,  Neuromuscular disease    GI/Hepatic   Endo/Other    Renal/GU      Musculoskeletal  (+) Fibromyalgia -  Abdominal   Peds  Hematology   Anesthesia Other Findings   Reproductive/Obstetrics                             Anesthesia Physical Anesthesia Plan  ASA: II  Anesthesia Plan: General   Post-op Pain Management:    Induction: Intravenous  PONV Risk Score and Plan:   Airway Management Planned: Oral ETT  Additional Equipment:   Intra-op Plan:   Post-operative Plan:   Informed Consent: I have reviewed the patients History and Physical, chart, labs and discussed the procedure including the risks, benefits and alternatives for the proposed anesthesia with the patient or authorized representative who has indicated his/her understanding and acceptance.     Plan Discussed with: CRNA  Anesthesia Plan Comments:         Anesthesia Quick Evaluation

## 2018-03-27 ENCOUNTER — Telehealth: Payer: Self-pay | Admitting: *Deleted

## 2018-03-27 NOTE — Telephone Encounter (Signed)
Received Lab Report results from [facility]; forwarded to provider/SLS  10/04

## 2018-03-31 LAB — SURGICAL PATHOLOGY

## 2018-04-24 ENCOUNTER — Other Ambulatory Visit: Payer: Self-pay | Admitting: Adult Health

## 2018-04-27 ENCOUNTER — Telehealth: Payer: Self-pay | Admitting: Adult Health

## 2018-04-27 MED ORDER — DULOXETINE HCL 60 MG PO CPEP: 60.0000 mg | ORAL_CAPSULE | Freq: Every day | ORAL | 5 refills | Status: DC

## 2018-04-27 MED ORDER — DULOXETINE HCL 30 MG PO CPEP: 30.0000 mg | ORAL_CAPSULE | Freq: Every morning | ORAL | 5 refills | Status: DC

## 2018-04-27 MED FILL — SHIPPING COST: 1 days supply | Qty: 1 | Fill #10

## 2018-04-27 MED FILL — DULoxetine HCL 60 MG CPEP: 60 | 30 days supply | Qty: 30 | Fill #0

## 2018-04-27 MED FILL — DULoxetine HCL 30 MG CPEP: 30 | 30 days supply | Qty: 30 | Fill #0

## 2018-04-27 NOTE — Telephone Encounter (Signed)
Refills changed and sent to [place].

## 2018-04-27 NOTE — Telephone Encounter (Signed)
Patient requesting Cymbalta 60mg  and Cymbalta 30mg  be sent to [facility]. Patient says Rx has to be sent there for insurance to pay.

## 2018-04-27 NOTE — Addendum Note (Signed)
Addended by: Minna Antis on: 04/27/2018 02:58 PM   Modules accepted: Orders

## 2018-04-27 NOTE — Telephone Encounter (Signed)
Pt called stating she is currently out of Cymbalta 30MG  and 60MG . Requesting a refill to last until upcoming appt on 11/6 sent to [facility].

## 2018-04-27 NOTE — Telephone Encounter (Signed)
Refilled Cymbalta 60 mg, Cymbalta 30 mg x 6 months to [facility] as patient requested.

## 2018-04-29 ENCOUNTER — Ambulatory Visit: Payer: 59 | Admitting: Family

## 2018-04-29 ENCOUNTER — Telehealth: Payer: Self-pay

## 2018-04-29 ENCOUNTER — Ambulatory Visit: Payer: 59 | Admitting: Family Medicine

## 2018-04-29 ENCOUNTER — Other Ambulatory Visit: Payer: Self-pay

## 2018-04-29 ENCOUNTER — Ambulatory Visit: Payer: 59 | Admitting: Neurology

## 2018-04-29 ENCOUNTER — Encounter: Payer: Self-pay | Admitting: Neurology

## 2018-04-29 VITALS — BP 149/91 | HR 66 | Resp 16 | Ht 66.0 in | Wt 149.0 lb

## 2018-04-29 VITALS — BP 124/80 | HR 65 | Temp 98.4°F | Resp 18 | Ht 66.0 in | Wt 149.0 lb

## 2018-04-29 DIAGNOSIS — M797 Fibromyalgia: Secondary | ICD-10-CM | POA: Diagnosis not present

## 2018-04-29 DIAGNOSIS — G43109 Migraine with aura, not intractable, without status migrainosus: Secondary | ICD-10-CM

## 2018-04-29 DIAGNOSIS — D509 Iron deficiency anemia, unspecified: Secondary | ICD-10-CM | POA: Diagnosis not present

## 2018-04-29 DIAGNOSIS — E611 Iron deficiency: Secondary | ICD-10-CM

## 2018-04-29 LAB — CBC WITH DIFFERENTIAL/PLATELET
BASOS ABS: 0.1 10*3/uL (ref 0.0–0.1)
BASOS PCT: 1.1 % (ref 0.0–3.0)
EOS ABS: 0.2 10*3/uL (ref 0.0–0.7)
Eosinophils Relative: 2.6 % (ref 0.0–5.0)
HEMATOCRIT: 42.2 % (ref 36.0–46.0)
HEMOGLOBIN: 13.7 g/dL (ref 12.0–15.0)
LYMPHS PCT: 28.4 % (ref 12.0–46.0)
Lymphs Abs: 2.1 10*3/uL (ref 0.7–4.0)
MCHC: 32.5 g/dL (ref 30.0–36.0)
MCV: 83.4 fl (ref 78.0–100.0)
MONO ABS: 0.5 10*3/uL (ref 0.1–1.0)
Monocytes Relative: 6.2 % (ref 3.0–12.0)
Neutro Abs: 4.5 10*3/uL (ref 1.4–7.7)
Neutrophils Relative %: 61.7 % (ref 43.0–77.0)
PLATELETS: 244 10*3/uL (ref 150.0–400.0)
RBC: 5.06 Mil/uL (ref 3.87–5.11)
RDW: 18.5 % — ABNORMAL HIGH (ref 11.5–15.5)
WBC: 7.3 10*3/uL (ref 4.0–10.5)

## 2018-04-29 LAB — IRON: Iron: 85 ug/dL (ref 42–145)

## 2018-04-29 LAB — B12 AND FOLATE PANEL: VITAMIN B 12: 452 pg/mL (ref 211–911)

## 2018-04-29 LAB — IBC PANEL
Iron: 85 ug/dL (ref 42–145)
Saturation Ratios: 18.5 % — ABNORMAL LOW (ref 20.0–50.0)
TRANSFERRIN: 328 mg/dL (ref 212.0–360.0)

## 2018-04-29 LAB — FERRITIN: FERRITIN: 25.4 ng/mL (ref 10.0–291.0)

## 2018-04-29 MED ORDER — ACETAMINOPHEN-CODEINE #3 300-30 MG PO TABS: 1.0000 | ORAL_TABLET | Freq: Three times a day (TID) | ORAL | 0 refills | Status: AC | PRN

## 2018-04-29 NOTE — Telephone Encounter (Signed)
Patient came in today to see Melissa and asked if Dr. Nani Ravens can refill Tylenol #3 for her.She also stated she seen her neurologist on 04/27/18 and she asked if PCP can take over Cymbalta. Patient states she has only a one month supply of the Cymbalta, if you can refill and also refill her Tylenol #3.   Please advise

## 2018-04-29 NOTE — Progress Notes (Signed)
Subjective:    Patient ID: Heather Maldonado, female    DOB: Jun 10, 1961, 57 y.o.   MRN: [id number]  HPI  Patient is a 57 yr old female who presents today for follow up of her iron deficiency anemia.  She recently had oral surgery and was told that her iron level and blood count were both quite low.  She reported that she was experiencing fatigue as well as some burning of her tongue.  Her primary care provider placed her on an iron supplement about 1 month ago.  She reports that since she started the iron supplement her fatigue has improved.  She reports improvement in the tongue discomfort as well since starting iron.  She reported that she was having ice cravings.  Reports that her tongue burns constantly. Reports that she had oral surgery o  Lab Results  Component Value Date   WBC 7.2 06/21/2016   HGB 14.4 06/21/2016   HCT 42.0 06/21/2016   MCV 84.6 06/21/2016   PLT 249.0 06/21/2016   Reports that she takes 4 tabs of ? "generic aleve" every morning for headaches she takes acetaminophen every night for headaches.  She is followed by neurology and saw them today. Review of Systems   See HPI  Past Medical History:  Diagnosis Date  . Colon polyp   . Common migraine with intractable migraine 02/03/2017  . Fibromyalgia   . Headache   . History of chicken pox      Social History   Socioeconomic History  . Marital status: Married    Spouse name: Matt  . Number of children: 2  . Years of education: 73  . Highest education level: Not on file  Occupational History  . Occupation: [employer]  Social Needs  . Financial resource strain: Not on file  . Food insecurity:    Worry: Not on file    Inability: Not on file  . Transportation needs:    Medical: Not on file    Non-medical: Not on file  Tobacco Use  . Smoking status: Never Smoker  . Smokeless tobacco: Never Used  Substance and Sexual Activity  . Alcohol use: Yes    Comment: SOCIALLY  . Drug use: No  . Sexual  activity: Yes    Birth control/protection: Surgical  Lifestyle  . Physical activity:    Days per week: Not on file    Minutes per session: Not on file  . Stress: Not on file  Relationships  . Social connections:    Talks on phone: Not on file    Gets together: Not on file    Attends religious service: Not on file    Active member of club or organization: Not on file    Attends meetings of clubs or organizations: Not on file    Relationship status: Not on file  . Intimate partner violence:    Fear of current or ex partner: Not on file    Emotionally abused: Not on file    Physically abused: Not on file    Forced sexual activity: Not on file  Other Topics Concern  . Not on file  Social History Narrative   Lives with husband   Caffeine use: coffee daily- 3 cups   Right handed    Past Surgical History:  Procedure Laterality Date  . CESAREAN SECTION  1985  . COLONOSCOPY WITH PROPOFOL N/A 11/25/2017   Procedure: COLONOSCOPY WITH PROPOFOL;  Surgeon: Lucilla Lame, MD;  Location: [facility] ENDOSCOPY;  Service: Endoscopy;  Laterality: N/A;  . ELBOW SURGERY Right 2004   tendon   . ENDOMETRIAL ABLATION  2011  . 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 SESMOIDECTOMY Right 2005   right big toe  . TUBAL LIGATION  1997  . ureter repair  1990   kink in ureter    Family History  Problem Relation Age of Onset  . Cancer Maternal Aunt        Breast    Allergies  Allergen Reactions  . Bee Venom Anaphylaxis    Current Outpatient Medications on File Prior to Visit  Medication Sig Dispense Refill  . DULoxetine (CYMBALTA) 30 MG capsule Take 1 capsule (30 mg total) by mouth every morning. 30 capsule 5  . DULoxetine (CYMBALTA) 60 MG capsule Take 1 capsule (60 mg total) by mouth at bedtime. 30 capsule 5  . EPINEPHrine (EPIPEN 2-PAK) 0.3 mg/0.3 mL IJ SOAJ injection  Inject in case of anaphylaxis. 2 Device 3  . Inulin (FIBER CHOICE Maldonado) Take 2 Scoops by mouth every morning.    . loratadine (CLARITIN) 10 MG tablet Take 10 mg by mouth daily.    . Multiple Vitamins-Minerals (MULTIVITAMIN Maldonado) Take 2 each by mouth daily. 2 gummies      No current facility-administered medications on file prior to visit.     BP 124/80 (BP Location: Left Arm, Patient Position: Sitting, Cuff Size: Normal)   Pulse 65   Temp 98.4 F (36.9 C) (Oral)   Resp 18   Ht _0  (1.676 m)   Wt 149 lb (67.6 kg)   SpO2 100%   BMI 24.05 kg/m        Objective:   Physical Exam  Constitutional: She appears well-developed and well-nourished.  Eyes:  Slightly pale conjunctiva   Cardiovascular: Normal rate, regular rhythm and normal heart sounds.  No murmur heard. Pulmonary/Chest: Effort normal and breath sounds normal. No respiratory distress. She has no wheezes.  Psychiatric: She has a normal mood and affect. Her behavior is normal. Judgment and thought content normal.          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  She is taking 4 Aleve every morning.  Discussed that this can break down the lining of her stomach and cause oozing of blood through the GI tract.  I have advised her to try transitioning to acetaminophen in the morning in place of Aleve for her headaches.   She will be sent home with an I fob kit to complete and return.    She is up-to-date on her colonoscopy.  Will check CBC and iron studies.  We will also check a B12 and folate level.

## 2018-04-29 NOTE — Progress Notes (Signed)
Reason for visit: Fibromyalgia, migraine headache  Heather Maldonado is an 56 y.o. female  History of present illness:  Heather Maldonado is a 57 year old right-handed white female with a history of migraine headache and fibromyalgia.  The patient has done quite well on Cymbalta, she is taking a total of 90 mg daily.  The patient is averaging about one headache a week, she can usually easily get rid of the headache with the use of Advil.  The headache duration is usually about an hour.  Occasionally, the headache may be more incapacitating.  The patient is doing much better with her fibromyalgia symptoms, she is trying to stay active, she walks 3-4 times a week walking 2-1/2 miles.  The patient is sleeping well at night.  Overall, she feels much better on Cymbalta.  She returns to this office for an evaluation.  Past Medical History:  Diagnosis Date  . Colon polyp   . Common migraine with intractable migraine 02/03/2017  . Fibromyalgia   . Headache   . History of chicken pox     Past Surgical History:  Procedure Laterality Date  . CESAREAN SECTION  1985  . COLONOSCOPY WITH PROPOFOL N/A 11/25/2017   Procedure: COLONOSCOPY WITH PROPOFOL;  Surgeon: Lucilla Lame, MD;  Location: [facility] ENDOSCOPY;  Service: Endoscopy;  Laterality: N/A;  . ELBOW SURGERY Right 2004   tendon   . ENDOMETRIAL ABLATION  2011  . 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 SESMOIDECTOMY Right 2005   right big toe  . TUBAL LIGATION  1997  . ureter repair  1990   kink in ureter    Family History  Problem Relation Age of Onset  . Cancer Maternal Aunt        Breast    Social history:  reports that she has never smoked. She has never used smokeless tobacco. She reports that she drinks alcohol. She reports that she does not use drugs.    Allergies  Allergen Reactions  . Bee Venom Anaphylaxis     Medications:  Prior to Admission medications   Medication Sig Start Date End Date Taking? Authorizing Provider  DULoxetine (CYMBALTA) 30 MG capsule Take 1 capsule (30 mg total) by mouth every morning. 04/27/18  Yes Kathrynn Ducking, MD  DULoxetine (CYMBALTA) 60 MG capsule Take 1 capsule (60 mg total) by mouth at bedtime. 04/27/18  Yes Kathrynn Ducking, MD  EPINEPHrine (EPIPEN 2-PAK) 0.3 mg/0.3 mL IJ SOAJ injection Inject in case of anaphylaxis. 11/29/16  Yes Wendling, Crosby Oyster, DO  Inulin (FIBER CHOICE PO) Take 2 Scoops by mouth every morning.   Yes [provider]  loratadine (CLARITIN) 10 MG tablet Take 10 mg by mouth daily.   Yes [provider]  Multiple Vitamins-Minerals (MULTIVITAMIN PO) Take 2 each by mouth daily. 2 gummies    Yes [provider]    ROS:  Out of a complete 14 system review of symptoms, the patient complains only of the following symptoms, and all other reviewed systems are negative.  Headache  Blood pressure (!) 149/91, pulse 66, resp. rate 16, height 5\' 6"  (1.676 m), weight 149 lb (67.6 kg).  Physical Exam  General: The patient is alert and cooperative at the time of the examination.  Skin: No significant peripheral edema is noted.   Neurologic Exam  Mental status: The patient is alert and  oriented x 3 at the time of the examination. The patient has apparent normal recent and remote memory, with an apparently normal attention span and concentration ability.   Cranial nerves: Facial symmetry is present. Speech is normal, no aphasia or dysarthria is noted. Extraocular movements are full. Visual fields are full.  Motor: The patient has good strength in all 4 extremities.  Sensory examination: Soft touch sensation is symmetric on the face, arms, and legs.  Coordination: The patient has good finger-nose-finger and heel-to-shin bilaterally.  Gait and station: The patient has a normal gait. Tandem gait is normal. Romberg is  negative. No drift is seen.  Reflexes: Deep tendon reflexes are symmetric.   Assessment/Plan:  1.  Fibromyalgia  2.  Migraine headache  The patient is doing quite well.  She will remain on the current dose of Cymbalta, the prescription was just sent in yesterday.  The patient will follow-up in 1 year.  If desired, the patient may get her primary doctor to take over the prescription and she can consolidate doctors.  Jill Alexanders MD 04/29/2018 8:48 AM  [facility] [address] [place], [place] [address]  Phone [phone] Fax [phone]

## 2018-04-29 NOTE — Patient Instructions (Signed)
Please complete lab work prior to leaving. Complete stool cards and mail back at your earliest convenience. Please discontinue use of NSAIDS. You may use tylenol as needed for headache but do not exceed 3000mg  in 24 hrs. Continue iron supplement.

## 2018-04-29 NOTE — Telephone Encounter (Signed)
I can take over. She will need to see me twice yearly from here on out. One for a med check and one for a physical. Please schedule her in 6 mo for either of her choosing. TY.

## 2018-04-30 ENCOUNTER — Encounter: Payer: Self-pay | Admitting: Family

## 2018-04-30 NOTE — Telephone Encounter (Signed)
Called the patient informed of PCP instructions. She scheduled an appt in May 2020.

## 2018-05-07 ENCOUNTER — Other Ambulatory Visit (INDEPENDENT_AMBULATORY_CARE_PROVIDER_SITE_OTHER): Payer: 59

## 2018-05-07 DIAGNOSIS — D509 Iron deficiency anemia, unspecified: Secondary | ICD-10-CM | POA: Diagnosis not present

## 2018-05-07 LAB — FECAL OCCULT BLOOD, IMMUNOCHEMICAL: Fecal Occult Bld: POSITIVE — AB

## 2018-05-08 ENCOUNTER — Telehealth: Payer: Self-pay | Admitting: Family

## 2018-05-08 ENCOUNTER — Telehealth: Payer: Self-pay | Admitting: Family Medicine

## 2018-05-08 DIAGNOSIS — R195 Other fecal abnormalities: Secondary | ICD-10-CM

## 2018-05-08 MED ORDER — PANTOPRAZOLE SODIUM 40 MG PO TBEC: 40.0000 mg | DELAYED_RELEASE_TABLET | Freq: Every day | ORAL | 3 refills | Status: DC

## 2018-05-08 NOTE — Telephone Encounter (Signed)
Notified pt and she voices understanding. 

## 2018-05-08 NOTE — Telephone Encounter (Signed)
Let her know that her stool was positive for blood.  Most likely cause is overuse of anti-inflammatories as we discussed at her appointment.  Please avoid any further use of ibuprofen or Aleve or aspirin-containing products.  I would like her to add Protonix which is an antacid to protect her stomach once daily and I would like to refer her back to her gastroenterologist for further evaluation.  Please call if you develop black or bloody stools, shortness of breath, weakness.  Go to ER if symptoms are severe.

## 2018-05-08 NOTE — Telephone Encounter (Signed)
Pt taking Fe and tolerating well. She is taking NSAIDs quite a bit which could cause false positive. Nml colonoscopy in June of this year. No upper abd pain. Will plan to monitor s/s's, routinely monitor CBC at f/u. Rec'd decreasing amount of NSAIDs, agree with PPI rec.

## 2018-05-09 ENCOUNTER — Telehealth: Payer: 59 | Admitting: Family

## 2018-05-09 ENCOUNTER — Encounter: Payer: Self-pay | Admitting: Family Medicine

## 2018-05-09 DIAGNOSIS — Z719 Counseling, unspecified: Secondary | ICD-10-CM

## 2018-05-09 NOTE — Progress Notes (Signed)
Thank you for the details you included in the comment boxes. Those details are very helpful in determining the best course of treatment for you and help [place] to provide the best care. This requires a face-to-face evaluation. With all of the [facility] insurance options changing, your best option is to go into your [facility] and reach out to your PCP to request a referral. That is not a service we offer through the [facility] program. Either way, you should be seen face-to-face ASAP even at an urgent care or primary so that a proper exam can be performed and treatment given. It may not require a podiatrist yet we cannot make this determination from a distance here.  Based on what you shared with me it looks like you have a serious condition that should be evaluated in a face to face office visit.  NOTE: If you entered your credit card information for this eVisit, you will not be charged. You may see a "hold" on your card for the $30 but that hold will drop off and you will not have a charge processed.  If you are having a true medical emergency please call 911.  If you need an urgent face to face visit, [facility] has four urgent care centers for your convenience.  If you need care fast and have a high deductible or no insurance consider:   [email or web address] to reserve your spot online an avoid wait times  [facility] [address], [address] [place], [place] [address] 8 am to 8 pm Monday-Friday 10 am to 4 pm Saturday-Sunday *Across the street from [employer]  [place], [address] 8 am to 5 pm Monday-Friday * In the [facility] on the [facility]   The following sites will take your  insurance:  . [facility] [place] a Provider at this Location  [address] [place], [place] [address] . 10 am to 8 pm Monday-Friday . 12 pm to 8 pm Saturday-Sunday   . [facility] at [place] a Provider at this Location  [address], [place], [place] [address] . 8 am to 8 pm Monday-Friday . 9 am to 6 pm Saturday . 11 am to 6 pm Sunday   . [facility] at [place] Get Driving Directions  [address] [place], [place] [address] . 8 am to 8 pm Monday-Friday . 8 am to 4 pm Saturday-Sunday   Your e-visit answers were reviewed by a board certified advanced clinical practitioner to complete your personal care plan.  Thank you for using e-Visits.

## 2018-05-11 NOTE — Telephone Encounter (Signed)
Author phoned pt. To f/u toenail. Pt. states it is still painful, inflammed. No openings today, and pt. unable to do this PM. Author encouraged urgent care if pain is severe, and pt. Stated she would reach out to urgent care regarding insurance coverage, but in the meantime, agreed to make an OV with Dr. Nani Ravens for 11/20 at [facility].

## 2018-05-13 ENCOUNTER — Encounter: Payer: Self-pay | Admitting: Family Medicine

## 2018-05-13 ENCOUNTER — Ambulatory Visit: Payer: 59 | Admitting: Family Medicine

## 2018-05-13 VITALS — BP 108/72 | HR 73 | Temp 98.0°F | Ht 66.0 in | Wt 148.4 lb

## 2018-05-13 DIAGNOSIS — L980 Pyogenic granuloma: Secondary | ICD-10-CM

## 2018-05-13 DIAGNOSIS — L989 Disorder of the skin and subcutaneous tissue, unspecified: Secondary | ICD-10-CM

## 2018-05-13 NOTE — Patient Instructions (Addendum)
Do not shower for the rest of the day. When you do wash it, use only soap and water. Do not vigorously scrub. Apply triple antibiotic ointment (like Neosporin) twice daily. Keep the area clean and dry when you aren't doing any soaks.   Things to look out for: increasing pain not relieved by ibuprofen/acetaminophen, fevers, spreading redness, drainage of pus, or foul odor.  Twice daily soaks with betadine or epsom salt in warm water. Soak for 10-15 minutes per session. Dab it dry.   Let us know if you need anything.

## 2018-05-13 NOTE — Progress Notes (Signed)
Chief Complaint  Patient presents with  . Toe Pain    left big toe    Heather Maldonado is a 57 y.o. female here for a skin complaint.  Duration: 2 weeks, tried to cut out an ingrown toenail, new area over the fold Location: L great toe Pruritic? No Painful? Yes Drainage? Yes Other associated symptoms: Denies fevers Therapies tried thus far: peroxide, TAO  ROS:  Const: No fevers Skin: As noted in HPI  Past Medical History:  Diagnosis Date  . Colon polyp   . Common migraine with intractable migraine 02/03/2017  . Fibromyalgia   . Headache   . History of chicken pox     BP 108/72 (BP Location: Left Arm, Patient Position: Sitting, Cuff Size: Normal)   Pulse 73   Temp 98 F (36.7 C) (Oral)   Ht 5\' 6"  (1.676 m)   Wt 148 lb 6 oz (67.3 kg)   SpO2 98%   BMI 23.95 kg/m  Gen: awake, alert, appearing stated age Lungs: No accessory muscle use Skin: There is a small growth, measuring approximately 0.5 x 0.3 cm, over the medial nail fold on the great toe, left.  Serous fluid draining from it.  There is no fluctuance or purulence from it.  No foul odor.  There is no erythema.  It is tender to palpation.  Of note, I do not appreciate any deformity of the nail and there is no tenderness to palpation when pressing down on the nail plate. Psych: Age appropriate judgment and insight  Procedure note; shave biopsy Informed consent was obtained. The area was cleaned with alcohol and ring block was injected with 4 mL of 1% lidocaine without epinephrine. 0.3 mL of same was injected locally over growth. Betadine was then used to clean the area. A 15 blade scalpel was used to cut under the area of interest. The specimen was placed in a sterile specimen cup and sent to the lab. The area was then cauterized ensuring adequate hemostasis. The area was dressed with triple antibiotic ointment and a bandage. There were no complications noted. The patient tolerated the procedure well.  Skin lesion -  Plan: Dermatology pathology([facility]), PR SHAV SKIN LES < 0.5 CM TRUNK,ARM,LEG  I thought the overgrowth of skin was causing the issue of pain rather than an ingrown toenail mainly because she was not tender when I push down on the actual nail plate.  She will let me know if this is not continue to improve.  If it does not, we will consider a partial nail avulsion. Home instructions/care instructions provided in AVS.  Twice daily soaks with Epson salt or Betadine strongly encouraged.  Triple antibiotic ointment over area suggested as well.  We did send the specimen to the lab to make sure it is nothing sinister. F/u prn. The patient voiced understanding and agreement to the plan.  [place], DO 05/13/18 12:25 PM

## 2018-05-13 NOTE — Progress Notes (Signed)
Pre visit review using our clinic review tool, if applicable. No additional management support is needed unless otherwise documented below in the visit note. 

## 2018-05-14 ENCOUNTER — Encounter: Payer: Self-pay | Admitting: Family Medicine

## 2018-05-18 ENCOUNTER — Telehealth: Payer: Self-pay | Admitting: *Deleted

## 2018-05-18 NOTE — Telephone Encounter (Signed)
Received Dermatopathology Report results from [facility]; forwarded to provider/SLS

## 2018-05-26 ENCOUNTER — Other Ambulatory Visit: Payer: Self-pay | Admitting: Family Medicine

## 2018-05-26 MED FILL — SHIPPING COST: 1 days supply | Qty: 1 | Fill #11

## 2018-05-26 MED FILL — DULoxetine HCL 30 MG CPEP: 30 | 30 days supply | Qty: 30 | Fill #1

## 2018-05-26 MED FILL — DULOXETINE HCL 60 MG CPEP: 60 | 30 days supply | Qty: 30 | Fill #1

## 2018-05-26 NOTE — Telephone Encounter (Signed)
Copied from [place] [id number]. Topic: General - Other >> May 26, 2018  8:50 AM Lennox Solders wrote: Reason for CRM: pt is calling and she said dr wendling agreed to take over prescribing her cymbalta 30 mg and 60 mg . Pt would like #90 day supply of each mg. [place] outpt pharm. Pt does not need medication until end of dec 2019

## 2018-05-26 NOTE — Telephone Encounter (Signed)
Disp Refills Start End   DULoxetine (CYMBALTA) 30 MG capsule 30 capsule 5 04/27/2018    Sig - Route: Take 1 capsule (30 mg total) by mouth every morning. - Oral   Sent to pharmacy as: DULoxetine (CYMBALTA) 30 MG capsule   E-Prescribing Status: Receipt confirmed by pharmacy (04/27/2018 2:58 PM EST)    /  Disp Refills Start End   DULoxetine (CYMBALTA) 60 MG capsule 30 capsule 5 04/27/2018    Sig - Route: Take 1 capsule (60 mg total) by mouth at bedtime. - Oral   Sent to pharmacy as: DULoxetine (CYMBALTA) 60 MG capsule   E-Prescribing Status: Receipt confirmed by pharmacy (04/27/2018 2:58 PM EST)    / Refills are not due at this time.

## 2018-06-07 ENCOUNTER — Encounter: Payer: Self-pay | Admitting: Family Medicine

## 2018-06-08 MED ORDER — PANTOPRAZOLE SODIUM 40 MG PO TBEC: 40.0000 mg | DELAYED_RELEASE_TABLET | Freq: Every day | ORAL | 3 refills | Status: AC

## 2018-06-08 MED FILL — PANTOPRAZOLE SOD DR 40 MG T: 40 | 90 days supply | Qty: 90 | Fill #0

## 2018-06-08 MED FILL — SHIPPING COST: 1 days supply | Qty: 1 | Fill #12

## 2018-06-18 ENCOUNTER — Encounter: Payer: Self-pay | Admitting: Family Medicine

## 2018-06-18 ENCOUNTER — Ambulatory Visit: Payer: 59 | Admitting: Family Medicine

## 2018-06-18 VITALS — BP 128/82 | HR 61 | Temp 98.2°F | Resp 16 | Ht 66.0 in | Wt 151.0 lb

## 2018-06-18 DIAGNOSIS — J208 Acute bronchitis due to other specified organisms: Secondary | ICD-10-CM

## 2018-06-18 DIAGNOSIS — D509 Iron deficiency anemia, unspecified: Secondary | ICD-10-CM

## 2018-06-18 DIAGNOSIS — B9689 Other specified bacterial agents as the cause of diseases classified elsewhere: Secondary | ICD-10-CM

## 2018-06-18 LAB — CBC
HCT: 41.2 % (ref 36.0–46.0)
Hemoglobin: 13.8 g/dL (ref 12.0–15.0)
MCHC: 33.5 g/dL (ref 30.0–36.0)
MCV: 83.5 fl (ref 78.0–100.0)
Platelets: 258 10*3/uL (ref 150.0–400.0)
RBC: 4.94 Mil/uL (ref 3.87–5.11)
RDW: 17.8 % — ABNORMAL HIGH (ref 11.5–15.5)
WBC: 7.3 10*3/uL (ref 4.0–10.5)

## 2018-06-18 LAB — IBC PANEL
IRON: 87 ug/dL (ref 42–145)
SATURATION RATIOS: 24.4 % (ref 20.0–50.0)
TRANSFERRIN: 255 mg/dL (ref 212.0–360.0)

## 2018-06-18 LAB — FERRITIN: Ferritin: 42 ng/mL (ref 10.0–291.0)

## 2018-06-18 LAB — POC INFLUENZA A&B (BINAX/QUICKVUE)
INFLUENZA A, POC: NEGATIVE
INFLUENZA B, POC: NEGATIVE

## 2018-06-18 MED ORDER — DOXYCYCLINE HYCLATE 100 MG PO TABS: 100.0000 mg | ORAL_TABLET | Freq: Two times a day (BID) | ORAL | 0 refills | Status: DC

## 2018-06-18 NOTE — Patient Instructions (Signed)
Continue to push fluids, practice good hand hygiene, and cover your mouth if you cough.  If you start having fevers, shaking or shortness of breath, seek immediate care.  For symptoms, consider using Vick's VapoRub on chest or under nose, air humidifier, Benadryl at night, and elevating the head of the bed. Tylenol and ibuprofen for aches and pains you may be experiencing.   Give [place] 2-3 business days to get the results of your labs back.   Let us know if you need anything.

## 2018-06-18 NOTE — Progress Notes (Signed)
Chief Complaint  Patient presents with  . Night Sweats    3 times last night, back of head pain-feels like its been pulled up tight, coughing brown phlem, sore thrat, body aches, no known fever    Heather Maldonado here for URI complaints.  Duration: 3 weeks  Associated symptoms: sinus congestion, night sweats, productive cough (brown phlegm), HA, nasal congestion, some runny nose, sore throat and myalgia Denies: sinus pain, itchy watery eyes, ear pain, ear drainage, wheezing and shortness of breath Treatment to date: Tylenol Sick contacts: No  ROS:  Const: +night sweats HEENT: As noted in HPI Lungs: No SOB  Past Medical History:  Diagnosis Date  . Colon polyp   . Common migraine with intractable migraine 02/03/2017  . Fibromyalgia   . Headache   . History of chicken pox     BP 128/82 (BP Location: Right Arm, Patient Position: Sitting, Cuff Size: Normal)   Pulse 61   Temp 98.2 F (36.8 C) (Oral)   Resp 16   Ht 5\' 6"  (1.676 m)   Wt 151 lb (68.5 kg)   SpO2 97%   BMI 24.37 kg/m  General: Awake, alert, appears stated age HEENT: AT, [place], ears patent b/l and TM's neg, nares patent w/o discharge, pharynx pink and without exudates, no sinus ttp, MMM Neck: No masses or asymmetry Heart: RRR Lungs: CTAB, no accessory muscle use Psych: Age appropriate judgment and insight, normal mood and affect  Acute bacterial bronchitis - Plan: POC Influenza A&B (Binax test), doxycycline (VIBRA-TABS) 100 MG tablet  Iron deficiency anemia, unspecified iron deficiency anemia type - Plan: IBC panel, Ferritin, CBC  Given duration and description of phlegm, will tx. Flu neg.  Continue to push fluids, practice good hand hygiene, cover mouth when coughing. Will f/u on Fe, she has been taking ferrous sulfate 2-3 times daily.  F/u prn. If starting to experience fevers, shaking, or shortness of breath, seek immediate care. Pt voiced understanding and agreement to the plan.  [place],  DO 06/18/18 10:12 AM

## 2018-06-26 MED FILL — DULoxetine HCL 30 MG CPEP: 30 | 30 days supply | Qty: 30 | Fill #2

## 2018-06-26 MED FILL — DULoxetine HCL 60 MG CPEP: 60 | 30 days supply | Qty: 30 | Fill #2

## 2018-06-29 MED FILL — SHIPPING COST: 1 days supply | Qty: 1 | Fill #13

## 2018-07-27 MED FILL — DULoxetine HCL 30 MG CPEP: 30 | 30 days supply | Qty: 30 | Fill #3

## 2018-07-27 MED FILL — SHIPPING COST: 1 days supply | Qty: 1 | Fill #0

## 2018-07-27 MED FILL — DULOXETINE HCL 60 MG CPEP: 60 | 30 days supply | Qty: 30 | Fill #3

## 2018-08-27 MED FILL — DULOXETINE HCL 60 MG CPEP: 60 | 30 days supply | Qty: 30 | Fill #4

## 2018-08-27 MED FILL — DULoxetine HCL 30 MG CPEP: 30 | 30 days supply | Qty: 30 | Fill #4

## 2018-08-27 MED FILL — SHIPPING COST: 1 days supply | Qty: 1 | Fill #1

## 2018-09-14 ENCOUNTER — Other Ambulatory Visit: Payer: 59

## 2018-09-22 ENCOUNTER — Telehealth: Payer: Self-pay | Admitting: Family Medicine

## 2018-09-22 NOTE — Telephone Encounter (Signed)
Pt left vm asking if Dr. Nani Ravens could call in her Duloxetine 30 mg & 60mg . Per pt she has an ov with her new pcp next week on Wednesday but she is just needing a qty of 5-6 more until her appt next week.

## 2018-09-23 MED ORDER — DULOXETINE HCL 30 MG PO CPEP: 30.0000 mg | ORAL_CAPSULE | Freq: Every morning | ORAL | 5 refills | Status: AC

## 2018-09-23 MED ORDER — DULOXETINE HCL 60 MG PO CPEP: 60.0000 mg | ORAL_CAPSULE | Freq: Every day | ORAL | 5 refills | Status: AC

## 2018-09-23 NOTE — Telephone Encounter (Signed)
Sent!

## 2018-10-29 ENCOUNTER — Ambulatory Visit: Payer: 59 | Admitting: Family Medicine

## 2018-12-15 ENCOUNTER — Encounter

## 2018-12-15 ENCOUNTER — Ambulatory Visit: Payer: 59 | Admitting: Obstetrics & Gynecology

## 2019-05-03 ENCOUNTER — Ambulatory Visit: Payer: 59 | Admitting: Adult Health
